# Patient Record
Sex: Male | Born: 1960
Health system: Southern US, Community
[De-identification: ages and names within clinical notes are randomized; demographics above are authoritative.]

## PROBLEM LIST (undated history)

## (undated) DIAGNOSIS — I10 Essential (primary) hypertension: Secondary | ICD-10-CM

## (undated) DIAGNOSIS — K5900 Constipation, unspecified: Secondary | ICD-10-CM

## (undated) DIAGNOSIS — E785 Hyperlipidemia, unspecified: Secondary | ICD-10-CM

## (undated) DIAGNOSIS — T7840XA Allergy, unspecified, initial encounter: Secondary | ICD-10-CM

## (undated) DIAGNOSIS — R079 Chest pain, unspecified: Secondary | ICD-10-CM

## (undated) DIAGNOSIS — M1990 Unspecified osteoarthritis, unspecified site: Secondary | ICD-10-CM

## (undated) DIAGNOSIS — Z789 Other specified health status: Secondary | ICD-10-CM

## (undated) HISTORY — DX: Hyperlipidemia, unspecified: E78.5

## (undated) HISTORY — PX: EYE SURGERY: SHX253

## (undated) HISTORY — DX: Constipation, unspecified: K59.00

## (undated) HISTORY — DX: Allergy, unspecified, initial encounter: T78.40XA

## (undated) HISTORY — DX: Chest pain, unspecified: R07.9

---

## 2003-06-29 ENCOUNTER — Emergency Department (HOSPITAL_COMMUNITY): Admission: EM | Admit: 2003-06-29 | Discharge: 2003-06-30 | Payer: Self-pay | Admitting: Emergency Medicine

## 2005-10-23 ENCOUNTER — Emergency Department (HOSPITAL_COMMUNITY): Admission: EM | Admit: 2005-10-23 | Discharge: 2005-10-23 | Payer: Self-pay | Admitting: Emergency Medicine

## 2009-05-31 ENCOUNTER — Emergency Department (HOSPITAL_COMMUNITY): Admission: EM | Admit: 2009-05-31 | Discharge: 2009-06-01 | Payer: Self-pay | Admitting: Emergency Medicine

## 2011-02-09 LAB — CBC
HCT: 41.9 % (ref 39.0–52.0)
Hemoglobin: 14.5 g/dL (ref 13.0–17.0)
MCV: 87.7 fL (ref 78.0–100.0)
RDW: 13.8 % (ref 11.5–15.5)

## 2011-02-09 LAB — BASIC METABOLIC PANEL
BUN: 19 mg/dL (ref 6–23)
CO2: 26 mEq/L (ref 19–32)
Chloride: 106 mEq/L (ref 96–112)
GFR calc non Af Amer: >60 mL/min (ref >60)
Glucose, Bld: 129 mg/dL — ABNORMAL HIGH (ref 70–99)
Potassium: 3.6 mEq/L (ref 3.5–5.1)
Sodium: 139 mEq/L (ref 135–145)

## 2011-02-09 LAB — DIFFERENTIAL
Basophils Absolute: 0 10*3/uL (ref 0.0–0.1)
Eosinophils Absolute: 0.2 10*3/uL (ref 0.0–0.7)
Eosinophils Relative: 3 % (ref 0–5)
Lymphocytes Relative: 24 % (ref 12–46)
Monocytes Absolute: 0.6 10*3/uL (ref 0.1–1.0)

## 2011-12-04 ENCOUNTER — Other Ambulatory Visit: Payer: Self-pay | Admitting: *Deleted

## 2011-12-04 ENCOUNTER — Ambulatory Visit
Admission: RE | Admit: 2011-12-04 | Discharge: 2011-12-04 | Disposition: A | Discharge status: Home / Self Care | Payer: 59 | Source: Ambulatory Visit | Attending: *Deleted | Admitting: *Deleted

## 2011-12-04 DIAGNOSIS — M545 Low back pain, unspecified: Secondary | ICD-10-CM

## 2011-12-05 ENCOUNTER — Other Ambulatory Visit: Payer: Self-pay

## 2011-12-06 ENCOUNTER — Other Ambulatory Visit: Payer: Self-pay

## 2011-12-26 ENCOUNTER — Encounter (HOSPITAL_COMMUNITY): Payer: Self-pay | Admitting: Pharmacy Technician

## 2011-12-26 ENCOUNTER — Encounter (HOSPITAL_COMMUNITY): Payer: Self-pay

## 2011-12-26 NOTE — Progress Notes (Signed)
Called Dr. Lindalou Hose office, left voice message for Erie Noe requesting orders for surgery.

## 2011-12-29 ENCOUNTER — Encounter (HOSPITAL_COMMUNITY): Payer: Self-pay | Admitting: General Practice

## 2011-12-29 ENCOUNTER — Ambulatory Visit (HOSPITAL_COMMUNITY): Payer: 59 | Admitting: Anesthesiology

## 2011-12-29 ENCOUNTER — Encounter (HOSPITAL_COMMUNITY): Payer: Self-pay

## 2011-12-29 ENCOUNTER — Encounter (HOSPITAL_COMMUNITY)
Admission: RE | Disposition: A | Discharge status: Home / Self Care | Payer: Self-pay | Source: Ambulatory Visit | Attending: Neurosurgery

## 2011-12-29 ENCOUNTER — Encounter (HOSPITAL_COMMUNITY): Payer: Self-pay | Admitting: Anesthesiology

## 2011-12-29 ENCOUNTER — Ambulatory Visit (HOSPITAL_COMMUNITY): Payer: 59

## 2011-12-29 ENCOUNTER — Ambulatory Visit (HOSPITAL_COMMUNITY)
Admission: RE | Admit: 2011-12-29 | Discharge: 2011-12-29 | Disposition: A | Discharge status: Home / Self Care | Payer: 59 | Source: Ambulatory Visit | Attending: Neurosurgery | Admitting: Neurosurgery

## 2011-12-29 DIAGNOSIS — M5126 Other intervertebral disc displacement, lumbar region: Secondary | ICD-10-CM | POA: Insufficient documentation

## 2011-12-29 DIAGNOSIS — M5116 Intervertebral disc disorders with radiculopathy, lumbar region: Secondary | ICD-10-CM | POA: Diagnosis present

## 2011-12-29 DIAGNOSIS — F17200 Nicotine dependence, unspecified, uncomplicated: Secondary | ICD-10-CM | POA: Insufficient documentation

## 2011-12-29 HISTORY — PX: LUMBAR LAMINECTOMY/DECOMPRESSION MICRODISCECTOMY: SHX5026

## 2011-12-29 HISTORY — DX: Unspecified osteoarthritis, unspecified site: M19.90

## 2011-12-29 HISTORY — PX: LAMINECTOMY AND MICRODISCECTOMY LUMBAR SPINE: SHX1913

## 2011-12-29 HISTORY — DX: Other specified health status: Z78.9

## 2011-12-29 LAB — ABO/RH: ABO/RH(D): O NEG

## 2011-12-29 LAB — DIFFERENTIAL
Basophils Absolute: 0 10*3/uL (ref 0.0–0.1)
Basophils Relative: 0 % (ref 0–1)
Eosinophils Relative: 3 % (ref 0–5)
Monocytes Absolute: 0.4 10*3/uL (ref 0.1–1.0)
Neutro Abs: 3.1 10*3/uL (ref 1.7–7.7)

## 2011-12-29 LAB — SURGICAL PCR SCREEN
MRSA, PCR: NEGATIVE
Staphylococcus aureus: NEGATIVE

## 2011-12-29 LAB — CBC
HCT: 42.6 % (ref 39.0–52.0)
MCHC: 34.5 g/dL (ref 30.0–36.0)
MCV: 84.7 fL (ref 78.0–100.0)
Platelets: 207 10*3/uL (ref 150–400)
RDW: 13.5 % (ref 11.5–15.5)

## 2011-12-29 SURGERY — LUMBAR LAMINECTOMY/DECOMPRESSION MICRODISCECTOMY 1 LEVEL
Anesthesia: General | Site: Back | Laterality: Right | Wound class: Clean

## 2011-12-29 MED ORDER — ONDANSETRON HCL 4 MG/2ML IJ SOLN
INTRAMUSCULAR | Status: DC | PRN
  Administered 2011-12-29: 4 mg via INTRAVENOUS

## 2011-12-29 MED ORDER — ONDANSETRON HCL 4 MG/2ML IJ SOLN: 4.0000 mg | INTRAMUSCULAR | Status: DC | PRN

## 2011-12-29 MED ORDER — CEFAZOLIN SODIUM-DEXTROSE 2-3 GM-% IV SOLR: 2.0000 g | INTRAVENOUS | Status: DC

## 2011-12-29 MED ORDER — MUPIROCIN 2 % EX OINT
TOPICAL_OINTMENT | CUTANEOUS | Status: AC
  Filled 2011-12-29: qty 22

## 2011-12-29 MED ORDER — MENTHOL 3 MG MT LOZG: 1.0000 | LOZENGE | OROMUCOSAL | Status: DC | PRN

## 2011-12-29 MED ORDER — CEFAZOLIN SODIUM 1-5 GM-% IV SOLN
1.0000 g | Freq: Three times a day (TID) | INTRAVENOUS | Status: DC
  Administered 2011-12-29: 1 g via INTRAVENOUS
  Filled 2011-12-29 (×3): qty 50

## 2011-12-29 MED ORDER — KETOROLAC TROMETHAMINE 30 MG/ML IJ SOLN
30.0000 mg | Freq: Four times a day (QID) | INTRAMUSCULAR | Status: DC
  Filled 2011-12-29: qty 1

## 2011-12-29 MED ORDER — ACETAMINOPHEN 650 MG RE SUPP: 650.0000 mg | RECTAL | Status: DC | PRN

## 2011-12-29 MED ORDER — LACTATED RINGERS IV SOLN: INTRAVENOUS | Status: DC

## 2011-12-29 MED ORDER — GLYCOPYRROLATE 0.2 MG/ML IJ SOLN
INTRAMUSCULAR | Status: DC | PRN
  Administered 2011-12-29: .6 mg via INTRAVENOUS

## 2011-12-29 MED ORDER — HEMOSTATIC AGENTS (NO CHARGE) OPTIME
TOPICAL | Status: DC | PRN
  Administered 2011-12-29: 1 via TOPICAL

## 2011-12-29 MED ORDER — MIDAZOLAM HCL 2 MG/2ML IJ SOLN
INTRAMUSCULAR | Status: AC
  Filled 2011-12-29: qty 2

## 2011-12-29 MED ORDER — SODIUM CHLORIDE 0.9 % IV SOLN
250.0000 mL | INTRAVENOUS | Status: DC
  Administered 2011-12-29: 250 mL via INTRAVENOUS

## 2011-12-29 MED ORDER — BACITRACIN 50000 UNITS IM SOLR
INTRAMUSCULAR | Status: AC
  Filled 2011-12-29: qty 1

## 2011-12-29 MED ORDER — CYCLOBENZAPRINE HCL 10 MG PO TABS: 10.0000 mg | ORAL_TABLET | Freq: Three times a day (TID) | ORAL | Status: DC | PRN

## 2011-12-29 MED ORDER — CYCLOBENZAPRINE HCL 10 MG PO TABS: 10.0000 mg | ORAL_TABLET | Freq: Three times a day (TID) | ORAL | Status: AC | PRN

## 2011-12-29 MED ORDER — THROMBIN 5000 UNITS EX KIT
PACK | CUTANEOUS | Status: DC | PRN
  Administered 2011-12-29: 5000 [IU] via TOPICAL

## 2011-12-29 MED ORDER — PROMETHAZINE HCL 25 MG/ML IJ SOLN: 6.2500 mg | INTRAMUSCULAR | Status: DC | PRN

## 2011-12-29 MED ORDER — SODIUM CHLORIDE 0.9 % IJ SOLN: 3.0000 mL | INTRAMUSCULAR | Status: DC | PRN

## 2011-12-29 MED ORDER — BISACODYL 10 MG RE SUPP: 10.0000 mg | Freq: Every day | RECTAL | Status: DC | PRN

## 2011-12-29 MED ORDER — ROCURONIUM BROMIDE 100 MG/10ML IV SOLN
INTRAVENOUS | Status: DC | PRN
  Administered 2011-12-29: 50 mg via INTRAVENOUS

## 2011-12-29 MED ORDER — PROPOFOL 10 MG/ML IV EMUL
INTRAVENOUS | Status: DC | PRN
  Administered 2011-12-29: 200 mg via INTRAVENOUS

## 2011-12-29 MED ORDER — PHENYLEPHRINE HCL 10 MG/ML IJ SOLN
INTRAMUSCULAR | Status: DC | PRN
  Administered 2011-12-29: 80 ug via INTRAVENOUS

## 2011-12-29 MED ORDER — SODIUM CHLORIDE 0.9 % IJ SOLN: 3.0000 mL | Freq: Two times a day (BID) | INTRAMUSCULAR | Status: DC

## 2011-12-29 MED ORDER — HYDROCODONE-ACETAMINOPHEN 5-325 MG PO TABS: 1.0000 | ORAL_TABLET | ORAL | Status: DC | PRN

## 2011-12-29 MED ORDER — CEFAZOLIN SODIUM-DEXTROSE 2-3 GM-% IV SOLR
INTRAVENOUS | Status: AC
  Filled 2011-12-29: qty 50

## 2011-12-29 MED ORDER — SODIUM CHLORIDE 0.9 % IR SOLN
Status: DC | PRN
  Administered 2011-12-29: 15:00:00

## 2011-12-29 MED ORDER — HYDROMORPHONE HCL PF 1 MG/ML IJ SOLN
INTRAMUSCULAR | Status: AC
  Filled 2011-12-29: qty 1

## 2011-12-29 MED ORDER — BUPIVACAINE HCL (PF) 0.25 % IJ SOLN
INTRAMUSCULAR | Status: DC | PRN
  Administered 2011-12-29: 20 mL

## 2011-12-29 MED ORDER — POLYETHYLENE GLYCOL 3350 17 G PO PACK
17.0000 g | PACK | Freq: Every day | ORAL | Status: DC | PRN
  Filled 2011-12-29: qty 1

## 2011-12-29 MED ORDER — MIDAZOLAM HCL 5 MG/5ML IJ SOLN
INTRAMUSCULAR | Status: DC | PRN
  Administered 2011-12-29: 2 mg via INTRAVENOUS

## 2011-12-29 MED ORDER — DIPHENHYDRAMINE HCL 50 MG/ML IJ SOLN
INTRAMUSCULAR | Status: AC
  Filled 2011-12-29: qty 1

## 2011-12-29 MED ORDER — SODIUM CHLORIDE 0.9 % IV SOLN
INTRAVENOUS | Status: AC
  Filled 2011-12-29: qty 500

## 2011-12-29 MED ORDER — DEXAMETHASONE SODIUM PHOSPHATE 10 MG/ML IJ SOLN
INTRAMUSCULAR | Status: DC | PRN
  Administered 2011-12-29: 10 mg via INTRAVENOUS

## 2011-12-29 MED ORDER — NEOSTIGMINE METHYLSULFATE 1 MG/ML IJ SOLN
INTRAMUSCULAR | Status: DC | PRN
  Administered 2011-12-29: 4 mg via INTRAVENOUS

## 2011-12-29 MED ORDER — OXYCODONE-ACETAMINOPHEN 5-325 MG PO TABS: 1.0000 | ORAL_TABLET | ORAL | Status: AC | PRN

## 2011-12-29 MED ORDER — FENTANYL CITRATE 0.05 MG/ML IJ SOLN
INTRAMUSCULAR | Status: DC | PRN
  Administered 2011-12-29: 250 ug via INTRAVENOUS

## 2011-12-29 MED ORDER — ALUM & MAG HYDROXIDE-SIMETH 200-200-20 MG/5ML PO SUSP: 30.0000 mL | Freq: Four times a day (QID) | ORAL | Status: DC | PRN

## 2011-12-29 MED ORDER — HYDROMORPHONE HCL PF 1 MG/ML IJ SOLN: 0.5000 mg | INTRAMUSCULAR | Status: DC | PRN

## 2011-12-29 MED ORDER — ACETAMINOPHEN 325 MG PO TABS: 650.0000 mg | ORAL_TABLET | ORAL | Status: DC | PRN

## 2011-12-29 MED ORDER — 0.9 % SODIUM CHLORIDE (POUR BTL) OPTIME
TOPICAL | Status: DC | PRN
  Administered 2011-12-29: 1000 mL

## 2011-12-29 MED ORDER — KETOROLAC TROMETHAMINE 30 MG/ML IJ SOLN
INTRAMUSCULAR | Status: DC | PRN
  Administered 2011-12-29: 30 mg via INTRAVENOUS

## 2011-12-29 MED ORDER — SENNA 8.6 MG PO TABS
1.0000 | ORAL_TABLET | Freq: Two times a day (BID) | ORAL | Status: DC
  Filled 2011-12-29: qty 1

## 2011-12-29 MED ORDER — OXYCODONE-ACETAMINOPHEN 5-325 MG PO TABS: 1.0000 | ORAL_TABLET | ORAL | Status: DC | PRN

## 2011-12-29 MED ORDER — HYDROMORPHONE HCL PF 1 MG/ML IJ SOLN
0.2500 mg | INTRAMUSCULAR | Status: DC | PRN
  Administered 2011-12-29 (×2): 0.5 mg via INTRAVENOUS

## 2011-12-29 MED ORDER — DIPHENHYDRAMINE HCL 50 MG/ML IJ SOLN: 50.0000 mg | Freq: Once | INTRAMUSCULAR | Status: DC

## 2011-12-29 MED ORDER — LACTATED RINGERS IV SOLN
INTRAVENOUS | Status: DC | PRN
  Administered 2011-12-29 (×2): via INTRAVENOUS

## 2011-12-29 MED ORDER — PHENOL 1.4 % MT LIQD: 1.0000 | OROMUCOSAL | Status: DC | PRN

## 2011-12-29 MED ORDER — ZOLPIDEM TARTRATE 10 MG PO TABS: 10.0000 mg | ORAL_TABLET | Freq: Every evening | ORAL | Status: DC | PRN

## 2011-12-29 MED ORDER — FLEET ENEMA 7-19 GM/118ML RE ENEM: 1.0000 | ENEMA | Freq: Once | RECTAL | Status: DC | PRN

## 2011-12-29 SURGICAL SUPPLY — 53 items
BAG DECANTER FOR FLEXI CONT (MISCELLANEOUS) ×2 IMPLANT
BENZOIN TINCTURE PRP APPL 2/3 (GAUZE/BANDAGES/DRESSINGS) ×2 IMPLANT
BLADE SURG ROTATE 9660 (MISCELLANEOUS) IMPLANT
BRUSH SCRUB EZ PLAIN DRY (MISCELLANEOUS) ×2 IMPLANT
BUR CUTTER 7.0 ROUND (BURR) ×2 IMPLANT
CANISTER SUCTION 2500CC (MISCELLANEOUS) ×2 IMPLANT
CLOSURE STERI STRIP 1/2 X4 (GAUZE/BANDAGES/DRESSINGS) ×2 IMPLANT
CLOTH BEACON ORANGE TIMEOUT ST (SAFETY) ×2 IMPLANT
CONT SPEC 4OZ CLIKSEAL STRL BL (MISCELLANEOUS) ×2 IMPLANT
DECANTER SPIKE VIAL GLASS SM (MISCELLANEOUS) ×2 IMPLANT
DERMABOND ADVANCED (GAUZE/BANDAGES/DRESSINGS) ×1
DERMABOND ADVANCED .7 DNX12 (GAUZE/BANDAGES/DRESSINGS) ×1 IMPLANT
DRAPE LAPAROTOMY 100X72X124 (DRAPES) ×2 IMPLANT
DRAPE MICROSCOPE LEICA (MISCELLANEOUS) ×2 IMPLANT
DRAPE POUCH INSTRU U-SHP 10X18 (DRAPES) ×2 IMPLANT
DRAPE PROXIMA HALF (DRAPES) IMPLANT
DRAPE SURG 17X23 STRL (DRAPES) ×4 IMPLANT
ELECT REM PT RETURN 9FT ADLT (ELECTROSURGICAL) ×2
ELECTRODE REM PT RTRN 9FT ADLT (ELECTROSURGICAL) ×1 IMPLANT
GAUZE SPONGE 4X4 12PLY STRL LF (GAUZE/BANDAGES/DRESSINGS) ×2 IMPLANT
GAUZE SPONGE 4X4 16PLY XRAY LF (GAUZE/BANDAGES/DRESSINGS) IMPLANT
GLOVE BIO SURGEON STRL SZ 6.5 (GLOVE) ×4 IMPLANT
GLOVE BIOGEL PI IND STRL 6.5 (GLOVE) ×2 IMPLANT
GLOVE BIOGEL PI IND STRL 8.5 (GLOVE) ×1 IMPLANT
GLOVE BIOGEL PI INDICATOR 6.5 (GLOVE) ×2
GLOVE BIOGEL PI INDICATOR 8.5 (GLOVE) ×1
GLOVE ECLIPSE 8.5 STRL (GLOVE) ×4 IMPLANT
GLOVE EXAM NITRILE LRG STRL (GLOVE) IMPLANT
GLOVE EXAM NITRILE MD LF STRL (GLOVE) ×2 IMPLANT
GLOVE EXAM NITRILE XL STR (GLOVE) IMPLANT
GLOVE EXAM NITRILE XS STR PU (GLOVE) IMPLANT
GLOVE OPTIFIT SS 6.5 STRL BRWN (GLOVE) ×4 IMPLANT
GOWN BRE IMP SLV AUR LG STRL (GOWN DISPOSABLE) ×2 IMPLANT
GOWN BRE IMP SLV AUR XL STRL (GOWN DISPOSABLE) ×6 IMPLANT
GOWN STRL REIN 2XL LVL4 (GOWN DISPOSABLE) IMPLANT
KIT BASIN OR (CUSTOM PROCEDURE TRAY) ×2 IMPLANT
KIT ROOM TURNOVER OR (KITS) ×2 IMPLANT
NEEDLE HYPO 22GX1.5 SAFETY (NEEDLE) ×2 IMPLANT
NEEDLE SPNL 22GX3.5 QUINCKE BK (NEEDLE) ×2 IMPLANT
NS IRRIG 1000ML POUR BTL (IV SOLUTION) ×2 IMPLANT
PACK LAMINECTOMY NEURO (CUSTOM PROCEDURE TRAY) ×2 IMPLANT
PAD ARMBOARD 7.5X6 YLW CONV (MISCELLANEOUS) ×6 IMPLANT
RUBBERBAND STERILE (MISCELLANEOUS) ×4 IMPLANT
SPONGE GAUZE 4X4 12PLY (GAUZE/BANDAGES/DRESSINGS) ×2 IMPLANT
SPONGE SURGIFOAM ABS GEL SZ50 (HEMOSTASIS) ×2 IMPLANT
STRIP CLOSURE SKIN 1/2X4 (GAUZE/BANDAGES/DRESSINGS) ×2 IMPLANT
SUT VIC AB 2-0 CT1 18 (SUTURE) ×2 IMPLANT
SUT VIC AB 3-0 SH 8-18 (SUTURE) ×2 IMPLANT
SYR 20ML ECCENTRIC (SYRINGE) ×2 IMPLANT
TAPE CLOTH SURG 4X10 WHT LF (GAUZE/BANDAGES/DRESSINGS) ×2 IMPLANT
TOWEL OR 17X24 6PK STRL BLUE (TOWEL DISPOSABLE) ×2 IMPLANT
TOWEL OR 17X26 10 PK STRL BLUE (TOWEL DISPOSABLE) ×2 IMPLANT
WATER STERILE IRR 1000ML POUR (IV SOLUTION) ×2 IMPLANT

## 2011-12-29 NOTE — Transfer of Care (Signed)
Immediate Anesthesia Transfer of Care Note  Patient: Eric Everett  Procedure(s) Performed: Procedure(s) (LRB): LUMBAR LAMINECTOMY/DECOMPRESSION MICRODISCECTOMY 1 LEVEL (Right)  Patient Location: PACU  Anesthesia Type: General  Level of Consciousness: awake, alert  and oriented  Airway & Oxygen Therapy: Patient Spontanous Breathing and Patient connected to face mask oxygen  Post-op Assessment: Report given to PACU RN and Post -op Vital signs reviewed and stable  Post vital signs: Reviewed and stable  Complications: No apparent anesthesia complications

## 2011-12-29 NOTE — H&P (Signed)
Eric Everett is an 51 y.o. male.   Chief Complaint: Back and right lower extremity pain HPI: 51 year old male with severe back and right lower extremity pain failing conservative management. Workup demonstrates evidence of a right-sided L4-5 disc herniation. Patient has failed conservative management and presents now for laminotomy and microdiscectomy. No left-sided symptoms. No bowel or bladder symptoms.  Past Medical History  Diagnosis Date  . No pertinent past medical history   . Arthritis     L4-5- HNP    Past Surgical History  Procedure Date  . Eye surgery     1982, sports injury - R eye    Family History  Problem Relation Age of Onset  . Anesthesia problems Neg Hx    Social History:  reports that he has been smoking Cigarettes.  He has a 5 pack-year smoking history. He does not have any smokeless tobacco history on file. He reports that he drinks alcohol. He reports that he uses illicit drugs ("Crack" cocaine, Cocaine, and Marijuana).  Allergies: No Known Allergies  Medications Prior to Admission  Medication Dose Route Frequency Provider Last Rate Last Dose  . ceFAZolin (ANCEF) 2-3 GM-% IVPB SOLR           . ceFAZolin (ANCEF) IVPB 2 g/50 mL premix  2 g Intravenous 30 min Pre-Op Temple Pacini, MD      . diphenhydrAMINE (BENADRYL) 50 MG/ML injection           . diphenhydrAMINE (BENADRYL) injection 50 mg  50 mg Intravenous Once Temple Pacini, MD      . mupirocin ointment (BACTROBAN) 2 %            Medications Prior to Admission  Medication Sig Dispense Refill  . oxyCODONE-acetaminophen (PERCOCET) 10-650 MG per tablet Take 1 tablet by mouth every 6 (six) hours as needed. For pain        Results for orders placed during the hospital encounter of 12/29/11 (from the past 48 hour(s))  CBC     Status: Normal   Collection Time   12/29/11 11:45 AM      Component Value Range Comment   WBC 5.9  4.0 - 10.5 (K/uL)    RBC 5.03  4.22 - 5.81 (MIL/uL)    Hemoglobin 14.7  13.0 - 17.0  (g/dL)    HCT 45.4  09.8 - 11.9 (%)    MCV 84.7  78.0 - 100.0 (fL)    MCH 29.2  26.0 - 34.0 (pg)    MCHC 34.5  30.0 - 36.0 (g/dL)    RDW 14.7  82.9 - 56.2 (%)    Platelets 207  150 - 400 (K/uL)   DIFFERENTIAL     Status: Normal   Collection Time   12/29/11 11:45 AM      Component Value Range Comment   Neutrophils Relative 53  43 - 77 (%)    Neutro Abs 3.1  1.7 - 7.7 (K/uL)    Lymphocytes Relative 37  12 - 46 (%)    Lymphs Abs 2.2  0.7 - 4.0 (K/uL)    Monocytes Relative 7  3 - 12 (%)    Monocytes Absolute 0.4  0.1 - 1.0 (K/uL)    Eosinophils Relative 3  0 - 5 (%)    Eosinophils Absolute 0.2  0.0 - 0.7 (K/uL)    Basophils Relative 0  0 - 1 (%)    Basophils Absolute 0.0  0.0 - 0.1 (K/uL)   TYPE AND SCREEN  Status: Normal (Preliminary result)   Collection Time   12/29/11 12:20 PM      Component Value Range Comment   ABO/RH(D) O NEG      Antibody Screen PENDING      Sample Expiration 01/01/2012      No results found.  Review of Systems  Constitutional: Negative.   HENT: Negative.   Eyes: Negative.   Respiratory: Negative.   Cardiovascular: Negative.   Gastrointestinal: Negative.   Genitourinary: Negative.   Musculoskeletal: Negative.   Skin: Negative.   Neurological: Negative.   Endo/Heme/Allergies: Negative.   Psychiatric/Behavioral: Negative.     Blood pressure 153/81, pulse 62, temperature 98.2 F (36.8 C), temperature source Oral, resp. rate 20, height 6' (1.829 m), weight 102.059 kg (225 lb), SpO2 95.00%. Physical Exam  Constitutional: He is oriented to person, place, and time. He appears well-developed and well-nourished. No distress.  HENT:  Head: Normocephalic and atraumatic.  Right Ear: External ear normal.  Left Ear: External ear normal.  Nose: Nose normal.  Mouth/Throat: Oropharynx is clear and moist.  Eyes: Conjunctivae and EOM are normal. Pupils are equal, round, and reactive to light.  Neck: Normal range of motion. Neck supple. No tracheal deviation  present. No thyromegaly present.  Cardiovascular: Normal rate, regular rhythm, normal heart sounds and intact distal pulses.  Exam reveals no friction rub.   No murmur heard. Respiratory: Effort normal and breath sounds normal. No respiratory distress. He has no wheezes.  GI: Soft. Bowel sounds are normal. He exhibits no distension. There is no tenderness.  Musculoskeletal: Normal range of motion. He exhibits no edema and no tenderness.  Neurological: He is alert and oriented to person, place, and time. He has normal reflexes. He displays normal reflexes. No cranial nerve deficit. He exhibits normal muscle tone. Coordination normal.  Skin: Skin is warm and dry. No rash noted. He is not diaphoretic. No erythema. No pallor.  Psychiatric: He has a normal mood and affect. His behavior is normal. Judgment and thought content normal.     Assessment/Plan Right L4-5 herniated nucleus pulposus with radiculopathy. Plan right L4-5 laminotomy microdiscectomy. Risks and benefits have been explained. Patient wishes to proceed.  Lockie Bothun A 12/29/2011, 1:25 PM

## 2011-12-29 NOTE — Discharge Summary (Signed)
Physician Discharge Summary  Patient ID: Eric Everett MRN: [id number] DOB/AGE: 07/13/1961 51 y.o.  Admit date: 12/29/2011 Discharge date: 12/29/2011  Admission Diagnoses:  Discharge Diagnoses:  Principal Problem:  *Lumbar disc herniation with radiculopathy   Discharged Condition: good  Hospital Course: Admitted and underwent an uncomplicated laminotomy and microdiscectomy on the right at L4-5. Postop radicular pain has resolved. Strength and sensation intact. Patient has voided and doing well. Plan for discharge home  Consults:   Significant Diagnostic Studies:   Treatments:   Discharge Exam: Blood pressure 123/72, pulse 64, temperature 97.9 F (36.6 C), temperature source Oral, resp. rate 19, height 6' (1.829 m), weight 102.059 kg (225 lb), SpO2 98.00%. Awake and alert oriented and appropriate. Cranial nerve function is intact. Motor and sensory function intact. Wound healing well. Chest and abdomen benign.  Disposition: Final discharge disposition not confirmed   Medication List  As of 12/29/2011  5:41 PM   STOP taking these medications         oxyCODONE-acetaminophen 10-650 MG per tablet         TAKE these medications         cyclobenzaprine 10 MG tablet   Commonly known as: FLEXERIL   Take 1 tablet (10 mg total) by mouth 3 (three) times daily as needed for muscle spasms.      oxyCODONE-acetaminophen 5-325 MG per tablet   Commonly known as: PERCOCET   Take 1-2 tablets by mouth every 4 (four) hours as needed for pain.           Follow-up Information    Follow up with Zeola Brys A, MD. Call in 1 week.   Contact information:   [address] [place] [address] [phone]          Signed: Temple Pacini 12/29/2011, 5:41 PM

## 2011-12-29 NOTE — Anesthesia Postprocedure Evaluation (Signed)
  Anesthesia Post-op Note  Patient: Eric Everett  Procedure(s) Performed: Procedure(s) (LRB): LUMBAR LAMINECTOMY/DECOMPRESSION MICRODISCECTOMY 1 LEVEL (Right)  Patient Location: PACU  Anesthesia Type: General  Level of Consciousness: awake, alert  and oriented  Airway and Oxygen Therapy: Patient Spontanous Breathing and Patient connected to nasal cannula oxygen  Post-op Pain: mild  Post-op Assessment: Post-op Vital signs reviewed, Patient's Cardiovascular Status Stable, Respiratory Function Stable, Patent Airway, No signs of Nausea or vomiting and Pain level controlled  Post-op Vital Signs: Reviewed and stable  Complications: No apparent anesthesia complications

## 2011-12-29 NOTE — Anesthesia Preprocedure Evaluation (Signed)
Anesthesia Evaluation  Patient identified by MRN, date of birth, ID band Patient awake    Reviewed: Allergy & Precautions, H&P , NPO status , Patient's Chart, lab work & pertinent test results  History of Anesthesia Complications Negative for: history of anesthetic complications  Airway Mallampati: I TM Distance: >3 FB Neck ROM: Full    Dental No notable dental hx. (+) Teeth Intact and Missing   Pulmonary Current Smoker,  clear to auscultation  Pulmonary exam normal       Cardiovascular neg cardio ROS Regular Normal    Neuro/Psych  Neuromuscular disease (chronic back pain- narcotic dependent) Negative Psych ROS   GI/Hepatic negative GI ROS, Neg liver ROS,   Endo/Other  Negative Endocrine ROS  Renal/GU negative Renal ROS     Musculoskeletal   Abdominal (+) obese,   Peds  Hematology negative hematology ROS (+)   Anesthesia Other Findings   Reproductive/Obstetrics                           Anesthesia Physical Anesthesia Plan  ASA: II  Anesthesia Plan: General   Post-op Pain Management:    Induction: Intravenous  Airway Management Planned: Oral ETT  Additional Equipment:   Intra-op Plan:   Post-operative Plan: Extubation in OR  Informed Consent: I have reviewed the patients History and Physical, chart, labs and discussed the procedure including the risks, benefits and alternatives for the proposed anesthesia with the patient or authorized representative who has indicated his/her understanding and acceptance.   Dental advisory given  Plan Discussed with: Surgeon and CRNA  Anesthesia Plan Comments: (Plan routine monitors, GETA)        Anesthesia Quick Evaluation

## 2011-12-29 NOTE — Op Note (Signed)
Date of procedure: 12/29/2011  Date of dictation: Same  Service: Neurosurgery  Preoperative diagnosis: Right L4-5 herniated nucleus pulposus with radiculopathy  Postoperative diagnosis: Same  Procedure Name: Right L4-5 laminotomy and microdiscectomy  Surgeon:Rudean Icenhour A.Braylee Lal, M.D.  Asst. Surgeon: Barnett Abu  Anesthesia: General  Indication: A 51 year old male with right lower extremity pain consistent with a right L5 radiculopathy. Workup demonstrates right L4-5 disc herniation with inferior free fragment causing compression of the right L5 nerve root. Patient presents now for microdiscectomy in hopes of improving his symptoms.  Operative note: After induction of anesthesia, patient positioned prone onto the Wilson frame and appropriate padded. Lumbar region prepped and draped sterilely. 10 blade used to make a linear skin incision overlying the L4-5 interspace. Subperiosteal dissection then performed spine was exposed x-ray was taken and the L3-4 level was localized. Dissection redirected 1 level caudally. Retractor was replaced. Laminotomy was then performed at the L4-5 level using high-speed drill and Kerrison rongeurs. Underlying thecal sac was identified. Ligament flavum was elevated and resected in piecemeal fashion. Underlying thecal sac and right L5 nerve or were identified. Microscope was brought into the field and used for microdissection of the epidural space. Epidural venous plexus was coagulated and cut. Thecal sac and L5 nerve root gently mobilized track towards midline. Disc herniation and disc space were identified. The disc was then incised 15 blade in a rectangular fashion. Y. disc space clear was achieved using pituitary rongeurs operative of rongeurs and Epstein curettes. The inferior free fragment was dissected free and fully resected. At this point a very thorough discectomy been achieved. There is a as of injury to thecal sac and nerve roots. Wound is then irrigated out  solution. Gelfoam with postoperative hemostasis which then the beaded. Microscope and retractor system were removed. Hemostasis of the muscle 2.left heart. Wounds and close in layers with Vicryl sutures. Steri-Strips and sterile dressing were applied. There were no apparent outpatient. Patient tolerated well and returned to the recovery room postop.

## 2011-12-29 NOTE — Preoperative (Signed)
Beta Blockers   Reason not to administer Beta Blockers:Not Applicable 

## 2011-12-29 NOTE — Brief Op Note (Signed)
12/29/2011  3:32 PM  PATIENT:  Chauncey Cruel  51 y.o. male  PRE-OPERATIVE DIAGNOSIS:  right herniated nucleus pulposus, lumbar four-five  POST-OPERATIVE DIAGNOSIS:  right herniated nucleus pulposus, lumbar four-five  PROCEDURE:  Procedure(s) (LRB): LUMBAR LAMINECTOMY/DECOMPRESSION MICRODISCECTOMY 1 LEVEL (Right)  SURGEON:  Surgeon(s) and Role:    * Temple Pacini, MD - Primary    * Stefani Dama, MD - Assisting  PHYSICIAN ASSISTANT:   ASSISTANTS: Barnett Abu  ANESTHESIA:   general  EBL:  Total I/O In: 1000 [I.V.:1000] Out: 50 [Blood:50]  BLOOD ADMINISTERED:none  DRAINS: none   LOCAL MEDICATIONS USED:  MARCAINE     SPECIMEN:  No Specimen  DISPOSITION OF SPECIMEN:  N/A  COUNTS:  YES  TOURNIQUET:  * No tourniquets in log *  DICTATION: .Dragon Dictation  PLAN OF CARE: Admit for overnight observation  PATIENT DISPOSITION:  PACU - hemodynamically stable.   Delay start of Pharmacological VTE agent (>24hrs) due to surgical blood loss or risk of bleeding: yes

## 2011-12-31 ENCOUNTER — Encounter (HOSPITAL_COMMUNITY): Payer: Self-pay | Admitting: Neurosurgery

## 2016-06-18 ENCOUNTER — Ambulatory Visit (INDEPENDENT_AMBULATORY_CARE_PROVIDER_SITE_OTHER): Payer: 59 | Admitting: Physician Assistant

## 2016-06-18 VITALS — BP 134/76 | HR 63 | Temp 98.3°F | Resp 18 | Ht 72.0 in | Wt 210.0 lb

## 2016-06-18 DIAGNOSIS — T7840XA Allergy, unspecified, initial encounter: Secondary | ICD-10-CM

## 2016-06-18 DIAGNOSIS — R220 Localized swelling, mass and lump, head: Secondary | ICD-10-CM

## 2016-06-18 MED ORDER — RANITIDINE HCL 150 MG PO TABS: 150.0000 mg | ORAL_TABLET | Freq: Two times a day (BID) | ORAL | 0 refills | Status: DC

## 2016-06-18 MED ORDER — CETIRIZINE HCL 10 MG PO TABS: 10.0000 mg | ORAL_TABLET | Freq: Every day | ORAL | 1 refills | Status: DC

## 2016-06-18 MED ORDER — METHYLPREDNISOLONE SODIUM SUCC 125 MG IJ SOLR
125.0000 mg | Freq: Once | INTRAMUSCULAR | Status: AC
  Administered 2016-06-18: 125 mg via INTRAMUSCULAR

## 2016-06-18 MED ORDER — HYDROCORTISONE ACETATE 25 MG RE SUPP: 25.0000 mg | Freq: Two times a day (BID) | RECTAL | 0 refills | Status: DC

## 2016-06-18 MED ORDER — EPINEPHRINE 0.3 MG/0.3ML IJ SOAJ: 0.3000 mg | Freq: Once | INTRAMUSCULAR | 1 refills | Status: AC

## 2016-06-18 NOTE — Progress Notes (Signed)
By signing my name below, I, [name], attest that this documentation has been prepared under the direction and in the presence of Trena PlattStephanie English, MD.  Electronically Signed: Arvilla MarketMesha Guinyard, Medical Scribe. 06/18/16. 8:58 AM.  Subjective:    Patient ID: Eric Everett, male    DOB: Sep 21, 1961, 55 y.o.   MRN: [id number]  HPI Chief Complaint  Patient presents with   Facial Swelling    Since "Sunday     HPI Comments: Eric Everett is a 55 y.o. male who presents to the [facility] complaining episodic facial swelling onset 3 days ago. Pt mentions his swelling went down 2 days ago, but his face became hot and itchy again when his lips began to swell. Pt mentions he dyed on his beard 6 days ago. Pt has been using this same dye for a long time, but suspects there is a new ingredient in the dye that he's allergic to. Pt mentions he's had facial swelling in the past, but not this bad. Pt took children's benadryl last night with little to relief to his symptoms. Pt denies trying any new lotions, soaps, or creams on his face. Pt denies, rash, SOB, and trouble breathing.  Patient Active Problem List   Diagnosis Date Noted   Lumbar disc herniation with radiculopathy 12/29/2011   Past Medical History:  Diagnosis Date   Allergy    Arthritis    L4-5- HNP   No pertinent past medical history    Past Surgical History:  Procedure Laterality Date   EYE SURGERY     19" 80, sports injury - R eye   LAMINECTOMY AND MICRODISCECTOMY LUMBAR SPINE  12/29/11   right L4-5   LUMBAR LAMINECTOMY/DECOMPRESSION MICRODISCECTOMY  12/29/2011   Procedure: LUMBAR LAMINECTOMY/DECOMPRESSION MICRODISCECTOMY 1 LEVEL;  Surgeon: Temple PaciniHenry A Pool, MD;  Location: [facility];  Service: Neurosurgery;  Laterality: Right;  Right Lumbar Four-Five Laminectomy Microdiskectomy   No Known Allergies Prior to Admission medications   Not on File   Social History   Social History   Marital status:  Married    Spouse name: N/A   Number of children: N/A   Years of education: N/A   Occupational History   Not on file.   Social History Main Topics   Smoking status: Former Smoker    Packs/day: 0.50    Years: 20.00    Types: Cigarettes   Smokeless tobacco: Never Used     Comment: "no consult; I quit after today" (12/29/11)   Alcohol use Yes     Comment: occassional   Drug use:     Types: "Crack" cocaine, Cocaine, Marijuana     Comment: no drug use isince 12/01/1994   Sexual activity: No   Other Topics Concern   Not on file   Social History Narrative   No narrative on file   Depression screen [facility] 2/9 06/18/2016  Decreased Interest 0  Down, Depressed, Hopeless 0  PHQ - 2 Score 0    Review of Systems  HENT: Positive for facial swelling.   Respiratory: Negative for shortness of breath and wheezing.   Skin: Negative for rash.  ROS reviewed. Otherwise unremarkable, unless listed above.  Objective:  Physical Exam  Constitutional: He appears well-developed and well-nourished. No distress.  HENT:  Head: Normocephalic and atraumatic.  Mouth/Throat: Oropharynx is clear and moist.  Left sided superficial upper lip swelling that extended around to the lower left side of the lip. Tongue was nl  Eyes: Conjunctivae are normal.  Neck: Neck supple.  Cardiovascular: Normal rate.   Pulmonary/Chest: Effort normal and breath sounds normal. No respiratory distress. He has no decreased breath sounds. He has no wheezes. He has no rales.  Neurological: He is alert.  Skin: Skin is warm and dry.  Psychiatric: He has a normal mood and affect. His behavior is normal.  Nursing note and vitals reviewed.  BP 122/78 (BP Location: Right Arm, Patient Position: Sitting, Cuff Size: Normal)    Pulse (!) 55    Temp 98 F (36.7 C) (Oral)    Resp 16    Ht 6' (1.829 m)    Wt 210 lb (95.3 kg)    SpO2 98%    BMI 28.48 kg/m    Assessment & Plan:  55 year old male for an allergic  reaction. Solu-medrol given today.  Advised h2blocker and anti-histamine at this time. Return if alarming symptoms occur discussed.  Allergic reaction, initial encounter - Plan: methylPREDNISolone sodium succinate (SOLU-MEDROL) 125 mg/2 mL injection 125 mg, ranitidine (ZANTAC) 150 MG tablet, cetirizine (ZYRTEC) 10 MG tablet, EPINEPHrine 0.3 mg/0.3 mL IJ SOAJ injection  Trena PlattStephanie English, PA-C [facility] 8/18/20178:12 AM  I personally performed the services described in this documentation, which was scribed in my presence. The recorded information has been reviewed and is accurate.  Patient returned with the sensation that his throat was closing up.  I advised that we will place an IV, epinephrine, and benadryl.  He will need to be monitored at the hospital.  He has declined.  He will go with wife to allergist.  Discussed the risk.  He has signed an ams form today.

## 2016-06-18 NOTE — Patient Instructions (Addendum)
Please avoid using the dye at this time Please take medication as prescribed.  Allergies An allergy is an abnormal reaction to a substance by the body's defense system (immune system). Allergies can develop at any age. WHAT CAUSES ALLERGIES? An allergic reaction happens when the immune system mistakenly reacts to a normally harmless substance, called an allergen, as if it were harmful. The immune system releases antibodies to fight the substance. Antibodies eventually release a chemical called histamine into the bloodstream. The release of histamine is meant to protect the body from infection, but it also causes discomfort. An allergic reaction can be triggered by:  Eating an allergen.  Inhaling an allergen.  Touching an allergen. WHAT TYPES OF ALLERGIES ARE THERE? There are many types of allergies. Common types include:  Seasonal allergies. People with this type of allergy are usually allergic to substances that are only present during certain seasons, such as molds and pollens.  Food allergies.  Drug allergies.  Insect allergies.  Animal dander allergies. WHAT ARE SYMPTOMS OF ALLERGIES? Possible allergy symptoms include:  Swelling of the lips, face, tongue, mouth, or throat.  Sneezing, coughing, or wheezing.  Nasal congestion.  Tingling in the mouth.  Rash.  Itching.  Itchy, red, swollen areas of skin (hives).  Watery eyes.  Vomiting.  Diarrhea.  Dizziness.  Lightheadedness.  Fainting.  Trouble breathing or swallowing.  Chest tightness.  Rapid heartbeat. HOW ARE ALLERGIES DIAGNOSED? Allergies are diagnosed with a medical and family history and one or more of the following:  Skin tests.  Blood tests.  A food diary. A food diary is a record of all the foods and drinks you have in a day and of all the symptoms you experience.  The results of an elimination diet. An elimination diet involves eliminating foods from your diet and then adding them back  in one by one to find out if a certain food causes an allergic reaction. HOW ARE ALLERGIES TREATED? There is no cure for allergies, but allergic reactions can be treated with medicine. Severe reactions usually need to be treated at a hospital. HOW CAN REACTIONS BE PREVENTED? The best way to prevent an allergic reaction is by avoiding the substance you are allergic to. Allergy shots and medicines can also help prevent reactions in some cases. People with severe allergic reactions may be able to prevent a life-threatening reaction called anaphylaxis with a medicine given right after exposure to the allergen.   This information is not intended to replace advice given to you by your health care provider. Make sure you discuss any questions you have with your health care provider.   Document Released: 01/13/2003 Document Revised: 11/10/2014 Document Reviewed: 08/01/2014 Elsevier Interactive Patient Education 2016 [employer]     IF you received an x-ray today, you will receive an invoice from [facility]. Please contact [facility] at [phone] with questions or concerns regarding your invoice.   IF you received labwork today, you will receive an invoice from [facility]. Please contact [facility] at [phone] with questions or concerns regarding your invoice.   Our billing staff will not be able to assist you with questions regarding bills from these companies.  You will be contacted with the lab results as soon as they are available. The fastest way to get your results is to activate your My Chart account. Instructions are located on the last page of this paperwork. If you have not heard from us regarding the results in 2 weeks, please contact  this office.

## 2017-12-24 DIAGNOSIS — I10 Essential (primary) hypertension: Secondary | ICD-10-CM | POA: Diagnosis not present

## 2018-01-11 DIAGNOSIS — I10 Essential (primary) hypertension: Secondary | ICD-10-CM | POA: Diagnosis not present

## 2018-07-14 DIAGNOSIS — R972 Elevated prostate specific antigen [PSA]: Secondary | ICD-10-CM | POA: Diagnosis not present

## 2018-07-14 DIAGNOSIS — Z0000 Encounter for general adult medical examination without abnormal findings: Secondary | ICD-10-CM | POA: Diagnosis not present

## 2018-07-14 DIAGNOSIS — K5900 Constipation, unspecified: Secondary | ICD-10-CM | POA: Diagnosis not present

## 2018-07-14 DIAGNOSIS — I10 Essential (primary) hypertension: Secondary | ICD-10-CM | POA: Diagnosis not present

## 2018-07-14 DIAGNOSIS — Z23 Encounter for immunization: Secondary | ICD-10-CM | POA: Diagnosis not present

## 2018-07-14 DIAGNOSIS — E782 Mixed hyperlipidemia: Secondary | ICD-10-CM | POA: Diagnosis not present

## 2018-09-14 DIAGNOSIS — E782 Mixed hyperlipidemia: Secondary | ICD-10-CM | POA: Diagnosis not present

## 2018-09-14 DIAGNOSIS — I10 Essential (primary) hypertension: Secondary | ICD-10-CM | POA: Diagnosis not present

## 2018-09-26 ENCOUNTER — Emergency Department (HOSPITAL_COMMUNITY)
Admission: EM | Admit: 2018-09-26 | Discharge: 2018-09-26 | Disposition: A | Discharge status: Home / Self Care | Payer: 59 | Attending: Emergency Medicine | Admitting: Emergency Medicine

## 2018-09-26 ENCOUNTER — Emergency Department (HOSPITAL_COMMUNITY): Payer: 59

## 2018-09-26 ENCOUNTER — Encounter (HOSPITAL_COMMUNITY): Payer: Self-pay

## 2018-09-26 DIAGNOSIS — Z79899 Other long term (current) drug therapy: Secondary | ICD-10-CM | POA: Diagnosis not present

## 2018-09-26 DIAGNOSIS — Z87891 Personal history of nicotine dependence: Secondary | ICD-10-CM | POA: Insufficient documentation

## 2018-09-26 DIAGNOSIS — I10 Essential (primary) hypertension: Secondary | ICD-10-CM | POA: Diagnosis not present

## 2018-09-26 DIAGNOSIS — R55 Syncope and collapse: Secondary | ICD-10-CM | POA: Diagnosis present

## 2018-09-26 HISTORY — DX: Essential (primary) hypertension: I10

## 2018-09-26 LAB — CBC
HEMATOCRIT: 47 % (ref 39.0–52.0)
Hemoglobin: 15.2 g/dL (ref 13.0–17.0)
MCH: 28.8 pg (ref 26.0–34.0)
MCHC: 32.3 g/dL (ref 30.0–36.0)
MCV: 89.2 fL (ref 80.0–100.0)
PLATELETS: 215 10*3/uL (ref 150–400)
RBC: 5.27 MIL/uL (ref 4.22–5.81)
RDW: 12.7 % (ref 11.5–15.5)
WBC: 5 10*3/uL (ref 4.0–10.5)
nRBC: 0 % (ref 0.0–0.2)

## 2018-09-26 LAB — URINALYSIS, ROUTINE W REFLEX MICROSCOPIC
Bacteria, UA: NONE SEEN
Bilirubin Urine: NEGATIVE
GLUCOSE, UA: NEGATIVE mg/dL
Ketones, ur: NEGATIVE mg/dL
Leukocytes, UA: NEGATIVE
NITRITE: NEGATIVE
Protein, ur: NEGATIVE mg/dL
Specific Gravity, Urine: 1.017 (ref 1.005–1.030)
pH: 6 (ref 5.0–8.0)

## 2018-09-26 LAB — I-STAT TROPONIN, ED: Troponin i, poc: 0 ng/mL (ref 0.00–0.08)

## 2018-09-26 LAB — BASIC METABOLIC PANEL
Anion gap: 7 (ref 5–15)
BUN: 15 mg/dL (ref 6–20)
CHLORIDE: 104 mmol/L (ref 98–111)
CO2: 26 mmol/L (ref 22–32)
CREATININE: 1.11 mg/dL (ref 0.61–1.24)
Calcium: 9.8 mg/dL (ref 8.9–10.3)
GFR calc non Af Amer: >60 mL/min (ref >60)
Glucose, Bld: 93 mg/dL (ref 70–99)
Potassium: 4.2 mmol/L (ref 3.5–5.1)
SODIUM: 137 mmol/L (ref 135–145)

## 2018-09-26 MED ORDER — AMLODIPINE BESYLATE 5 MG PO TABS: 5.0000 mg | ORAL_TABLET | Freq: Every day | ORAL | 1 refills | Status: DC

## 2018-09-26 NOTE — Discharge Instructions (Addendum)
You have mild blood pressure, elevation. It does not appear to be causing any serious problems at this time.  We are changing you to a new blood pressure medicine which has been sent to your pharmacy.  Stop taking the HCTZ, for now.  Hang onto the prescription for HCTZ, because you may need to restart it as a secondary medicine if the new medicine does not work.  Make sure you try to stay on a low-salt diet.  It is safe to use Tylenol or Motrin if needed for headache.  Follow-up with your primary care doctor for blood pressure check in 1 or 2 weeks.  Return here, if needed, for problems.

## 2018-09-26 NOTE — ED Provider Notes (Signed)
[facility] EMERGENCY DEPARTMENT Provider Note   CSN: [id number] Arrival date & time: 09/26/18  1505     History   Chief Complaint No chief complaint on file.   HPI Eric Everett is a 57 y.o. male.  HPI   He is here for evaluation of high blood pressure with a period of near syncope today while he was sitting on a couch.  He is also had headache for several days.  He saw a doctor about 2 weeks ago for evaluation of headache and high blood pressure and was prescribed HCTZ, which he started yesterday, 25 mg daily, he has had 2 doses so far.  States he ate some breakfast earlier today prior to the episode of near syncope.  He denies chest pain, shortness of breath, cough, fever, chills, nausea, vomiting, focal weakness or paresthesia.  History of high blood pressure about a year ago treated for short period time with diuretic, he stopped it on his own because he thought he is doing better.  Checked his blood pressure several times in the last several months and found to be elevated.  For him pressure elevations are in the range of 180/100.  There are no other known modifying factors.  Past Medical History:  Diagnosis Date  . Allergy   . Arthritis    L4-5- HNP  . Hypertension   . No pertinent past medical history     Patient Active Problem List   Diagnosis Date Noted  . Lumbar disc herniation with radiculopathy 12/29/2011    Past Surgical History:  Procedure Laterality Date  . EYE SURGERY     1982, sports injury - R eye  . LAMINECTOMY AND MICRODISCECTOMY LUMBAR SPINE  12/29/11   right L4-5  . LUMBAR LAMINECTOMY/DECOMPRESSION MICRODISCECTOMY  12/29/2011   Procedure: LUMBAR LAMINECTOMY/DECOMPRESSION MICRODISCECTOMY 1 LEVEL;  Surgeon: Temple Pacini, MD;  Location: [facility];  Service: Neurosurgery;  Laterality: Right;  Right Lumbar Four-Five Laminectomy Microdiskectomy        Home Medications    Prior to Admission medications   Medication Sig Start Date  End Date Taking? Authorizing Provider  amLODipine (NORVASC) 5 MG tablet Take 1 tablet (5 mg total) by mouth daily. 09/26/18   Mancel Bale, MD  cetirizine (ZYRTEC) 10 MG tablet Take 1 tablet (10 mg total) by mouth daily. 06/18/16 06/25/16  Trena Platt D, PA  hydrocortisone (ANUSOL-HC) 25 MG suppository Place 1 suppository (25 mg total) rectally 2 (two) times daily. 06/18/16   Garnetta Buddy, PA  ranitidine (ZANTAC) 150 MG tablet Take 1 tablet (150 mg total) by mouth 2 (two) times daily. 06/18/16 06/25/16  Garnetta Buddy, PA    Family History Family History  Problem Relation Age of Onset  . Heart disease Father   . Cancer Sister   . Stroke Brother   . Anesthesia problems Neg Hx     Social History Social History   Tobacco Use  . Smoking status: Former Smoker    Packs/day: 0.50    Years: 20.00    Pack years: 10.00    Types: Cigarettes  . Smokeless tobacco: Never Used  . Tobacco comment: "no consult; I quit after today" (12/29/11)  Substance Use Topics  . Alcohol use: Yes    Comment: occassional  . Drug use: Yes    Types: "Crack" cocaine, Cocaine, Marijuana    Comment: no drug use isince 12/01/1994     Allergies   Dairy aid [lactase]   Review of Systems  Review of Systems  All other systems reviewed and are negative.    Physical Exam Updated Vital Signs BP (!) 162/91 (BP Location: Left Arm)   Pulse 62   Temp 98.5 F (36.9 C) (Oral)   Resp 17   Ht 6' (1.829 m)   Wt 91.6 kg   SpO2 98%   BMI 27.40 kg/m   Physical Exam  Constitutional: He is oriented to person, place, and time. He appears well-developed and well-nourished. No distress.  HENT:  Head: Normocephalic and atraumatic.  Right Ear: External ear normal.  Left Ear: External ear normal.  Eyes: Pupils are equal, round, and reactive to light. Conjunctivae and EOM are normal.  Neck: Normal range of motion and phonation normal. Neck supple.  Cardiovascular: Normal rate, regular rhythm and  normal heart sounds.  Pulmonary/Chest: Effort normal and breath sounds normal. He exhibits no bony tenderness.  Abdominal: Soft. There is no tenderness.  Musculoskeletal: Normal range of motion.  Neurological: He is alert and oriented to person, place, and time. No cranial nerve deficit or sensory deficit. He exhibits normal muscle tone. Coordination normal.  No dysarthria, aphasia or nystagmus.  Skin: Skin is warm, dry and intact.  Psychiatric: He has a normal mood and affect. His behavior is normal. Judgment and thought content normal.  Nursing note and vitals reviewed.    ED Treatments / Results  Labs (all labs ordered are listed, but only abnormal results are displayed) Labs Reviewed  URINALYSIS, ROUTINE W REFLEX MICROSCOPIC - Abnormal; Notable for the following components:      Result Value   Hgb urine dipstick MODERATE (*)    All other components within normal limits  BASIC METABOLIC PANEL  CBC  I-STAT TROPONIN, ED    EKG EKG Interpretation  Date/Time:  Sunday September 26 2018 15:23:25 EST Ventricular Rate:  59 PR Interval:    QRS Duration: 88 QT Interval:  409 QTC Calculation: 406 R Axis:   81 Text Interpretation:  Sinus rhythm Left ventricular hypertrophy Borderline T abnormalities, lateral leads ST elevation, consider anterior injury Since last tracing Nonspecific T wave abnormality now evident in Lateral leads Confirmed by Mancel BaleWentz, Zelda Reames [phone]) on 09/26/2018 3:38:49 PM Also confirmed by Mancel BaleWentz, Avarose Mervine [phone]), editor Elita QuickWatlington, Beverly ([id number])  on 09/27/2018 7:12:34 AM   Radiology Dg Chest 2 View  Result Date: 09/26/2018 CLINICAL DATA:  Elevated blood pressure with chest pain. Previous episode 6 months ago. EXAM: CHEST - 2 VIEW COMPARISON:  06/01/2009 and 10/23/2005. FINDINGS: The heart size and mediastinal contours are normal. The lungs are clear. There is no pleural effusion or pneumothorax. No acute osseous findings are identified. Telemetry leads overlie the  chest. There are mild degenerative changes throughout the spine. IMPRESSION: Stable chest.  No active cardiopulmonary process. Electronically Signed   By: Carey BullocksWilliam  Veazey M.D.   On: 09/26/2018 16:38   Ct Head Wo Contrast  Result Date: 09/26/2018 CLINICAL DATA:  Syncopal episode this afternoon. Headaches for several months. History of hypertension. EXAM: CT HEAD WITHOUT CONTRAST TECHNIQUE: Contiguous axial images were obtained from the base of the skull through the vertex without intravenous contrast. COMPARISON:  CT HEAD June 01, 2009 FINDINGS: BRAIN: No intraparenchymal hemorrhage, mass effect nor midline shift. The ventricles and sulci are normal. No acute large vascular territory infarcts. No abnormal extra-axial fluid collections. Basal cisterns are patent. VASCULAR: Unremarkable. SKULL/SOFT TISSUES: No skull fracture. No significant soft tissue swelling. ORBITS/SINUSES: The included ocular globes and orbital contents are normal.Trace paranasal sinus mucosal thickening. Mastoid air  cells are well aerated. OTHER: None. IMPRESSION: Normal noncontrast CT HEAD. Electronically Signed   By: Awilda Metro M.D.   On: 09/26/2018 16:58    Procedures Procedures (including critical care time)  Medications Ordered in ED Medications - No data to display   Initial Impression / Assessment and Plan / ED Course  I have reviewed the triage vital signs and the nursing notes.  Pertinent labs & imaging results that were available during my care of the patient were reviewed by me and considered in my medical decision making (see chart for details).      Patient Vitals for the past 24 hrs:  BP Temp Temp src Pulse Resp SpO2 Height Weight  09/26/18 1853 (!) 162/91 98.5 F (36.9 C) Oral 62 17 98 % - -  09/26/18 1534 - - - - - - 6' (1.829 m) 91.6 kg  09/26/18 1533 (!) 172/79 98.5 F (36.9 C) Oral (!) 59 19 96 % - -  09/26/18 1530 (!) 177/77 - - (!) 58 (!) 22 96 % - -    6:37 PM Reevaluation with update  and discussion. After initial assessment and treatment, an updated evaluation reveals he is comfortable has no additional complaints.  Findings discussed with patient and wife.  He would like to change to a new blood pressure medicine, since the HCTZ has not worked yet.  All questions answered. Mancel Bale   Medical Decision Making: Mild high blood pressure without signs for endorgan damage or hypertensive urgency.  Patient recently stopped smoking cigarettes and may be having some withdrawal symptoms contributing to headache.  He has trace mucosal thickening of the sinuses, paranasal, unlikely to be causing headache.  Doubt serious bacterial infection, metabolic instability or impending vascular collapse.  CRITICAL CARE-no Performed by: Mancel Bale  Nursing Notes Reviewed/ Care Coordinated Applicable Imaging Reviewed Interpretation of Laboratory Data incorporated into ED treatment  The patient appears reasonably screened and/or stabilized for discharge and I doubt any other medical condition or other [facility] requiring further screening, evaluation, or treatment in the ED at this time prior to discharge.  Plan: Home Medications-continue routine medicines; Home Treatments-low-sodium diet; return here if the recommended treatment, does not improve the symptoms; Recommended follow up-PCP checkup 1 to 2 weeks.    Final Clinical Impressions(s) / ED Diagnoses   Final diagnoses:  Hypertension, unspecified type    ED Discharge Orders         Ordered    amLODipine (NORVASC) 5 MG tablet  Daily     09/26/18 1840           Mancel Bale, MD 09/27/18 1208

## 2018-09-26 NOTE — ED Triage Notes (Signed)
Pt sent from u/c.  Onset 2pm pt "blacked out".   Pt went to u/c started having left sided chest pain, lasting 4-5 min .    Onset several months headaches and high BP.  Pt started BP medication yesterday.   Denies chest pain at this time.

## 2020-07-16 ENCOUNTER — Encounter: Payer: Self-pay | Admitting: Internal Medicine

## 2020-07-16 ENCOUNTER — Other Ambulatory Visit: Payer: Self-pay

## 2020-07-16 ENCOUNTER — Ambulatory Visit: Payer: 59 | Admitting: Internal Medicine

## 2020-07-16 VITALS — BP 130/80 | Ht 72.0 in | Wt 210.0 lb

## 2020-07-16 DIAGNOSIS — E7849 Other hyperlipidemia: Secondary | ICD-10-CM

## 2020-07-16 DIAGNOSIS — I10 Essential (primary) hypertension: Secondary | ICD-10-CM | POA: Diagnosis not present

## 2020-07-16 NOTE — Patient Instructions (Addendum)
Medication Instructions:  Your physician recommends that you continue on your current medications as directed. Please refer to the Current Medication list given to you today.  *If you need a refill on your cardiac medications before your next appointment, please call your pharmacy*   Lab Work: none If you have labs (blood work) drawn today and your tests are completely normal, you will receive your results only by: Marland Kitchen MyChart Message (if you have MyChart) OR . A paper copy in the mail If you have any lab test that is abnormal or we need to change your treatment, we will call you to review the results.   Testing/Procedures:  Your physician has requested that you have an echocardiogram. Echocardiography is a painless test that uses sound waves to create images of your heart. It provides your doctor with information about the size and shape of your heart and how well your heart's chambers and valves are working. This procedure takes approximately one hour. There are no restrictions for this procedure.   Follow-Up: At [facility], you and your health needs are our priority.  As part of our continuing mission to provide you with exceptional heart care, we have created designated Provider Care Teams.  These Care Teams include your primary Cardiologist (physician) and Advanced Practice Providers (APPs -  Physician Assistants and Nurse Practitioners) who all work together to provide you with the care you need, when you need it.  We recommend signing up for the patient portal called "MyChart".  Sign up information is provided on this After Visit Summary.  MyChart is used to connect with patients for Virtual Visits (Telemedicine).  Patients are able to view lab/test results, encounter notes, upcoming appointments, etc.  Non-urgent messages can be sent to your provider as well.   To learn more about what you can do with MyChart, go to [email or web address].    Your next appointment:   1  year(s)  The format for your next appointment:   In Person  Provider:   Izora Ribas   Other Instructions:   Need to bring your BP cuff to your Echocardiogram appointment so we can correlate at a nurse visit that same day.   HOW TO TAKE YOUR BLOOD PRESSURE:  Rest 5 minutes before taking your blood pressure.   Don't smoke or drink caffeinated beverages for at least 30 minutes before.  Take your blood pressure before (not after) you eat.  Sit comfortably with your back supported and both feet on the floor (don't cross your legs).  Elevate your arm to heart level on a table or a desk.  Use the proper sized cuff. It should fit smoothly and snugly around your bare upper arm. There should be enough room to slip a fingertip under the cuff. The bottom edge of the cuff should be 1 inch above the crease of the elbow.  Ideally, take 3 measurements at one sitting and record the average.

## 2020-07-16 NOTE — Progress Notes (Signed)
Cardiology Office Note:    Date:  07/16/2020   ID:  Eric Everett, DOB Mar 19, 1961, MRN [id number]  PCP:  Soundra Pilon, FNP  [facility] Cardiologist:  No primary care provider on file.  [facility] Electrophysiologist:  None   Referring MD: Ileana Ladd, MD   CC: EKG Questions and blood pressure management Seen for the evaluation of EKG changes at the behest of Peri Maris.  History of Present Illness:    Eric Everett is a 59 y.o. male with a hx of hypertension, hyperlipidemia and EKG changes of LVH.  Patient notes that he feels the same that he has for years.  Incidentally went to the dentist three weeks ago and prior to getting his teeth cleaned and had SBP of 220 mm Hg.  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 No chest pain, shortness of breath, DOE, PND.  Coaches football and basketball.  No syncope or near syncope.  No issues with BP medications, though notes Norvasc occasional gives him some stomach cramping.  Has been off medications for sometime; did not want to do virtual visit and well and was lost to follow up.  Past Medical History:  Diagnosis Date  . Allergy   . Arthritis    L4-5- HNP  . Chest pain   . Constipation   . Hyperlipidemia   . Hypertension   . No pertinent past medical history     Past Surgical History:  Procedure Laterality Date  . EYE SURGERY     1982, sports injury - R eye  . LAMINECTOMY AND MICRODISCECTOMY LUMBAR SPINE  12/29/11   right L4-5  . LUMBAR LAMINECTOMY/DECOMPRESSION MICRODISCECTOMY  12/29/2011   Procedure: LUMBAR LAMINECTOMY/DECOMPRESSION MICRODISCECTOMY 1 LEVEL;  Surgeon: Temple Pacini, MD;  Location: [facility];  Service: Neurosurgery;  Laterality: Right;  Right Lumbar Four-Five Laminectomy Microdiskectomy   Current Medications: Current Meds  Medication Sig  . amLODipine (NORVASC) 5 MG tablet Take 5 mg by mouth in the morning and at bedtime.  Marland Kitchen  atorvastatin (LIPITOR) 20 MG tablet Take 20 mg by mouth daily.     Allergies:   Dairy aid [lactase] and Other   Social History   Socioeconomic History  . Marital status: Married    Spouse name: Not on file  . Number of children: Not on file  . Years of education: Not on file  . Highest education level: Not on file  Occupational History  . Not on file  Tobacco Use  . Smoking status: Former Smoker    Packs/day: 0.50    Years: 20.00    Pack years: 10.00    Types: Cigarettes  . Smokeless tobacco: Never Used  . Tobacco comment: "no consult; I quit after today" (12/29/11)  Substance and Sexual Activity  . Alcohol use: Yes    Comment: occassional  . Drug use: Yes    Types: "Crack" cocaine, Cocaine, Marijuana    Comment: no drug use isince 12/01/1994  . Sexual activity: Never  Other Topics Concern  . Not on file  Social History Narrative  . Not on file   Social Determinants of Health   Financial Resource Strain:   . Difficulty of Paying Living Expenses: Not on file  Food Insecurity:   . Worried About Programme researcher, broadcasting/film/video in the Last Year: Not on file  . Ran Out of Food in the Last Year: Not on  file  Transportation Needs:   . Freight forwarder (Medical): Not on file  . Lack of Transportation (Non-Medical): Not on file  Physical Activity:   . Days of Exercise per Week: Not on file  . Minutes of Exercise per Session: Not on file  Stress:   . Feeling of Stress : Not on file  Social Connections:   . Frequency of Communication with Friends and Family: Not on file  . Frequency of Social Gatherings with Friends and Family: Not on file  . Attends Religious Services: Not on file  . Active Member of Clubs or Organizations: Not on file  . Attends Banker Meetings: Not on file  . Marital Status: Not on file    Family History: The patient's family history includes Cancer in his sister; Heart disease in his father; Stroke in his brother. There is no history of  Anesthesia problems. Father had COPD and may have had   ROS:   Please see the history of present illness.    All other systems reviewed and are negative.  EKGs/Labs/Other Studies Reviewed:    The following studies were reviewed today:  EKG:  EKG is ordered today.  The ekg ordered today demonstrates sinus rhythm rate 68.  Less evidence of LVH 09/27/18- sinus bradycardia with LVH OSH Labs Random Glucose 134 Creatine 1.2 TSH 1.6  Physical Exam:    VS:  BP 130/80 (BP Location: Right Arm, Patient Position: Sitting, Cuff Size: Normal)   Ht 6' (1.829 m)   Wt 210 lb (95.3 kg)   SpO2 97%   BMI 28.48 kg/m     Wt Readings from Last 3 Encounters:  07/16/20 210 lb (95.3 kg)  09/26/18 202 lb (91.6 kg)  06/18/16 210 lb (95.3 kg)     GEN: Well nourished, well developed in no acute distress HEENT: Normal NECK: No JVD; No carotid bruits LYMPHATICS: No lymphadenopathy CARDIAC: RRR, II/VIsystolic heart murmur without change in hand grip or valsalva and best heard at RUSB, No rubs, gallops RESPIRATORY:  Clear to auscultation without rales, wheezing or rhonchi  ABDOMEN: Soft, non-tender, non-distended MUSCULOSKELETAL:  No edema; No deformity  SKIN: Warm and dry NEUROLOGIC:  Alert and oriented x 3 PSYCHIATRIC:  Normal affect   ASSESSMENT:    1. Essential hypertension   2. Other hyperlipidemia    PLAN:    In order of problems listed above:  1. Hypertension - on norvasc 5 mg; continue ambulatory blood pressure monitoring; will set up nurse visit for BP and cuff correlation  2. Hyperlipidemia - continue current mgmt (start date today and answered questions about meds) 3. Hyperglycemia  - will monitor 4. LV hypertrophy on ECG with systolic heart Murmur - asymptomatic from LVH, unclear family history; will get TTE  See in one year unless new symptoms occur  Medication Adjustments/Labs and Tests Ordered: Current medicines are reviewed at length with the patient today.  Concerns  regarding medicines are outlined above.  No orders of the defined types were placed in this encounter.  No orders of the defined types were placed in this encounter.   There are no Patient Instructions on file for this visit.   Signed, Christell Constant, MD  07/16/2020 2:18 PM    [facility]

## 2020-07-20 NOTE — Addendum Note (Signed)
Addended by: Kerrie Buffalo on: 07/20/2020 11:17 AM   Modules accepted: Orders

## 2020-08-01 ENCOUNTER — Other Ambulatory Visit (HOSPITAL_COMMUNITY): Payer: 59

## 2020-08-01 ENCOUNTER — Ambulatory Visit: Payer: 59

## 2020-08-01 ENCOUNTER — Encounter (HOSPITAL_COMMUNITY): Payer: Self-pay | Admitting: Internal Medicine

## 2020-08-01 NOTE — Progress Notes (Deleted)
Patient ID: Eric Everett                 DOB: [birth year]                      MRN: [id number]     HPI: Eric Everett is a 59 y.o. male referred by Dr. Izora Everett to HTN clinic. PMH is significant for HTN, HLD, and LVH. He went to the dentist a month ago and SBP was elevated at . BP was 130/56mmHg at last office visit 2 weeks ago. Pt was referred to HTN clinic and advised to bring in home cuff for calibration today, also here for echo as well.  Stomach cramping on amlodipine? Any LEE? Any other BP meds in the past? Start valsartan if needed  Current HTN meds: amlodipine 5mg  BID Previously tried: amlodipine - stomach cramping BP goal: <130/47mmHg  Family History: Cancer in his sister; Heart disease and COPD in his father; Stroke in his brother.   Social History: Former smoker 1/2 PPD for 20 years. Former cocaine and marijuana use, none since 1996. Occasional alcohol use.  Diet:   Exercise: Coaches football and basketball.  Home BP readings:   Labs: 06/29/20 [place]: SCr 1.2, K 4.5, Na 139  Wt Readings from Last 3 Encounters:  07/16/20 210 lb (95.3 kg)  09/26/18 202 lb (91.6 kg)  06/18/16 210 lb (95.3 kg)   BP Readings from Last 3 Encounters:  07/16/20 130/80  09/26/18 (!) 162/91  06/18/16 134/76   Pulse Readings from Last 3 Encounters:  09/26/18 62  06/18/16 63  12/29/11 64    Renal function: CrCl cannot be calculated (Patient's most recent lab result is older than the maximum 21 days allowed.).  Past Medical History:  Diagnosis Date  . Allergy   . Arthritis    L4-5- HNP  . Chest pain   . Constipation   . Hyperlipidemia   . Hypertension   . No pertinent past medical history     Current Outpatient Medications on File Prior to Visit  Medication Sig Dispense Refill  . amLODipine (NORVASC) 5 MG tablet Take 5 mg by mouth in the morning and at bedtime.    12/31/11 atorvastatin (LIPITOR) 20 MG tablet Take 20 mg by mouth daily.      No current  facility-administered medications on file prior to visit.    Allergies  Allergen Reactions  . Dairy Aid [Lactase]   . Other     nuts     Assessment/Plan:  1. Hypertension -

## 2020-08-06 ENCOUNTER — Other Ambulatory Visit: Payer: Self-pay

## 2020-08-06 ENCOUNTER — Ambulatory Visit (INDEPENDENT_AMBULATORY_CARE_PROVIDER_SITE_OTHER): Payer: 59 | Admitting: Pharmacist

## 2020-08-06 DIAGNOSIS — I10 Essential (primary) hypertension: Secondary | ICD-10-CM | POA: Diagnosis not present

## 2020-08-06 NOTE — Progress Notes (Deleted)
Patient ID: Eric Everett                 DOB: Feb 18, 1961                      MRN: [id number]     HPI: Eric Everett is a 59 y.o. male referred by Dr. Izora Ribas to HTN clinic. PMH is significant for hypertension, hyperlipidemia and EKG changes of LVH. Patient went to dentist for teeth cleaning and blood pressure was 220 systolic.   Na intake (fast food, canned foods, add salt, cook w/ salt?) Decongestants, NSAIDs, steroids Exercise Sleep?   Scr 1.2 K4.5 06/29/20   Current HTN meds: amlodipine 5mg   Previously tried: HCTZ, lisinpril/HCTZ BP goal: <130/80  Family History:   Social History: former smoker (  Diet:   Exercise:   Home BP readings:   Wt Readings from Last 3 Encounters:  07/16/20 210 lb (95.3 kg)  09/26/18 202 lb (91.6 kg)  06/18/16 210 lb (95.3 kg)   BP Readings from Last 3 Encounters:  07/16/20 130/80  09/26/18 (!) 162/91  06/18/16 134/76   Pulse Readings from Last 3 Encounters:  09/26/18 62  06/18/16 63  12/29/11 64    Renal function: CrCl cannot be calculated (Patient's most recent lab result is older than the maximum 21 days allowed.).  Past Medical History:  Diagnosis Date  . Allergy   . Arthritis    L4-5- HNP  . Chest pain   . Constipation   . Hyperlipidemia   . Hypertension   . No pertinent past medical history     Current Outpatient Medications on File Prior to Visit  Medication Sig Dispense Refill  . amLODipine (NORVASC) 5 MG tablet Take 5 mg by mouth in the morning and at bedtime.    12/31/11 atorvastatin (LIPITOR) 20 MG tablet Take 20 mg by mouth daily.      No current facility-administered medications on file prior to visit.    Allergies  Allergen Reactions  . Dairy Aid [Lactase]   . Other     nuts     Assessment/Plan:  1. Hypertension -   Marland Kitchen, Pharm.D, BCPS, CPP [facility]  [address] [place], [place]  Phone: [phone]; Fax: [phone]

## 2020-08-06 NOTE — Progress Notes (Addendum)
  Patient ID: Eric Cumpian                  DOB: [birth year]                      MRN: [id number]     HPI: Eric Everett is a 59 y.o. male referred by Dr. Izora Ribas to HTN clinic. PMH is significant for hypertension, hyperlipidemiaand EKG changes of LVH. Patient went to dentist for teeth cleaning and blood pressure was 220 systolic.   Today, patient presents to clinic in good spirits. Reports being off medications for some time after refills ran out and just didn't get them filled again. He has been on amlodipine for approximately 2 months now. He does not use decongestants, NSAIDs, or steroids. At home his BP runs 140-149/60s and HR normal (60-90s). He brought in his home BP cuff for calibration, which appeared to be inaccurately reading high - BP 160/86, HR 62. With the office manual cuff he was found to have a HR 68, BP 140/80. The office automatic cuff showed a BP of 134/75. He still occasionally smokes cigarettes/cigars socially and has a few alcoholic drinks every other week. He drinks about 2 cups of coffee a day but no other caffinated beverages.   Current HTN meds: amlodipine 5mg   Previously tried: HCTZ, lisinpril/HCTZ BP goal: <130/80  Family History:   Social History: Occasionally smokes cigarettes or cigars socially.  Sleeps about 7-8 hours a night, usually uninterrupted. Reports having night sweats before previous MD visit when BP was very high, but this has since mostly resolved.  Diet:  Eats out about once a week, not fast food.  No canned food or frozen dinners. His wife likes to keep the family eating healthy.  Little to no salt in cooking, no added salt at table.  About 2 cups of coffee a day, does not drink soda, tea, or energy drinks regularly.  Has a few (2-4) drinks of liquor about every other week when he goes out with friends but does not drink alcohol daily.   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  Home BP readings: 140-149/60s  Current Outpatient Medications  Medication Sig Dispense Refill  . amLODipine (NORVASC) 5 MG tablet Take 5 mg by mouth in the morning and at bedtime.    atorvastatin (LIPITOR) 20 MG tablet Take 20 mg by mouth daily.      No current facility-administered medications for this visit.    Assessment/Plan:  1. Hypertension - Today, patient's blood pressure was above goal of <130/80 mmHg. His home blood pressure machine appears to be showing higher values compared to our clinic readings. We recommended obtaining a new machine, such as the Omron Bronze Upper Arm or Omron Three Series. Encouraged the patient to check his blood pressures daily and keep a written log of values to bring to his next clinic visit. Plan to continue amlodipine 5mg  daily until we have several days of home readings to assess. Encouraged patient to increase weekly exercise and continue his current diet.   Marland Kitchen, PharmD PGY1 Acute Care Pharmacy Resident

## 2020-08-06 NOTE — Patient Instructions (Addendum)
Thank you for visiting [place] today!  Today your blood pressure was 140-150/80 which is above your goal of <130/80.   Your home blood pressure cuff appears to be inaccurate when compared to our office readings. We recommend obtaining a new machine, such as the Omron Bronze Upper Arm or Omron Three Series.   Please continue to check your blood pressures daily and keep a written log of your values to bring to your next clinic visit.   Keep up the great work with your diet and sleep! As discussed today, try to increase your weekly exercise as you are able.   For now, we will have you continue your current medications. Once you have checked your blood pressure for a few weeks, we will consider adjusting your blood pressure medications.   Medications Continue amlodipine 5 mg once daily  We will see you in clinic again on Friday, October 15th at 8:30am.

## 2020-08-13 ENCOUNTER — Telehealth (HOSPITAL_COMMUNITY): Payer: Self-pay | Admitting: Internal Medicine

## 2020-08-13 NOTE — Telephone Encounter (Signed)
Will send this message to the ordering Provider, as a general FYI.

## 2020-08-13 NOTE — Telephone Encounter (Signed)
Just an FYI. We have made several attempts to contact this patient including sending a letter to schedule or reschedule their echocardiogram. We will be removing the patient from the echo WQ.   08/01/2020 PT No Showed - MAILED LETTER/LBW  08/01/20 called and VM is full at 11:09/LBW     Thank you

## 2020-08-17 ENCOUNTER — Ambulatory Visit: Payer: 59

## 2020-08-17 NOTE — Progress Notes (Deleted)
  Patient ID: Eric Urieta                  DOB: 12/24/1960                      MRN: [id number]     HPI: Eric Everett is a 59 y.o. male referred by Dr. Izora Everett to HTN clinic. PMH is significant for hypertension, hyperlipidemiaand EKG changes of LVH. Patient went to dentist for teeth cleaning and blood pressure was 220 systolic. He has since been restarted on amlodipine 5mg  daily.  At last visit, his home meter was calibrated and was found to be higher than clinic cuff. We suggested patient purchase a new meter, ideally an upper arm monitor. No changes were made at last visit, due to unreliable home readings. Patient was encouraged to to increase his weekly exercise.  Today, patient presents to clinic in good spirits.  Increase amlodipine vs starting thiazide Dizziness, lightheadedness, headache, blurred vision, SOB, swelling Increase walking? Med rec New monitor?  Current HTN meds: amlodipine 5mg   Previously tried: HCTZ, lisinpril/HCTZ BP goal: <130/80  Family History:   Social History:  Sleeps about 7-8 hours a night, usually uninterrupted. Reports having night sweats before previous MD visit when BP was very high, but this has since mostly resolved. Still occasionally smokes cigarettes/cigars socially and has a few alcoholic drinks every other week.  Diet:  Eats out about once a week, not fast food.  No canned food or frozen dinners. His wife likes to keep the family eating healthy.  Little to no salt in cooking, no added salt at table.  About 2 cups of coffee a day, does not drink soda, tea, or energy drinks regularly.  Has a few (2-4) drinks of liquor about every other week when he goes out with friends but does not drink alcohol daily.   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  Home BP readings: 140-149/60s  Current Outpatient Medications  Medication Sig Dispense Refill  .  amLODipine (NORVASC) 5 MG tablet Take 5 mg by mouth in the morning and at bedtime.    atorvastatin (LIPITOR) 20 MG tablet Take 20 mg by mouth daily.      No current facility-administered medications for this visit.    Assessment/Plan:  1. Hypertension - Today, patient's blood pressure was above goal of <130/80 mmHg. His home blood pressure machine appears to be showing higher values compared to our clinic readings. We recommended obtaining a new machine, such as the Omron Bronze Upper Arm or Omron Three Series. Encouraged the patient to check his blood pressures daily and keep a written log of values to bring to his next clinic visit. Plan to continue amlodipine 5mg  daily until we have several days of home readings to assess. Encouraged patient to increase weekly exercise and continue his current diet.   , PharmD PGY1 Acute Care Pharmacy Resident

## 2021-06-25 ENCOUNTER — Ambulatory Visit
Admission: RE | Admit: 2021-06-25 | Discharge: 2021-06-25 | Disposition: A | Discharge status: Home / Self Care | Payer: 59 | Source: Ambulatory Visit | Attending: Sports Medicine | Admitting: Sports Medicine

## 2021-06-25 ENCOUNTER — Other Ambulatory Visit: Payer: Self-pay

## 2021-06-25 ENCOUNTER — Ambulatory Visit (INDEPENDENT_AMBULATORY_CARE_PROVIDER_SITE_OTHER): Payer: 59 | Admitting: Sports Medicine

## 2021-06-25 VITALS — Ht 72.0 in | Wt 210.0 lb

## 2021-06-25 DIAGNOSIS — M5412 Radiculopathy, cervical region: Secondary | ICD-10-CM

## 2021-06-25 MED ORDER — PREDNISONE 10 MG PO TABS: ORAL_TABLET | ORAL | 0 refills | Status: DC

## 2021-06-26 NOTE — Progress Notes (Signed)
   Subjective:    Patient ID: Eric Everett, male    DOB: January 14, 1961, 60 y.o.   MRN: [id number]  HPI chief complaint: Right-sided neck and arm pain  Very pleasant 60 year old ambidextrous male presents today complaining of 3 months of right-sided neck and arm pain.  He believes his pain may have begun after working out.  He describes an achy discomfort that begins in the right side of his neck and will radiate down the arm into the radial aspect of the right hand.  His symptoms are much worse with sitting and with driving a car.  Symptoms improved with walking and standing.  He also endorses some numbness and weakness in the right arm.  He is having some difficulty sleeping at night.  He has not had any previous neck surgery but he did undergo a single level lumbar laminectomy/decompression, microdiscectomy in 2013.  His current symptoms in his right arm are similar to what he experienced in his leg at that time.  He notes that a single 800 mg ibuprofen does help.  Past medical history reviewed Medications reviewed Allergies reviewed    Review of Systems As above    Objective:   Physical Exam  Well-developed, well-nourished.  No acute distress.  Cervical spine: Good cervical range of motion with a positive Spurling's to the right.  No tenderness along his cervical midline.  No spasm.  No atrophy.  There is decree sensation along the C6 dermatome in the right arm.  Trace reflexes at the biceps and brachial radialis tendons on the right compared to 1/4 on the left.  1/4 triceps reflexes bilaterally.  Good strength.  Good grip.  Good pulses.      Assessment & Plan:   Right-sided neck and arm pain secondary to cervical radiculopathy Status post remote single level lumbar spine laminectomy/decompression/microdiscectomy  Although the patient has not had cervical radiculopathy in the past, he did have lumbar radiculopathy which required surgical decompression.  Since his symptoms are the same  as what he felt in his leg previously, we are going to evaluate this further with x-rays and an MRI.  We will likely plan on a diagnostic/therapeutic cervical ESI once the MRI is done.  Phone follow-up with him with those results when available.  In the meantime, I did give him a prescription for a 6-day Sterapred Dosepak.  Although he tells me he is not diabetic, he did say that his PCP told him that his A1c was slightly elevated recently.  I do not have access to those records so I've asked him to check with his PCP first before starting his steroids.

## 2021-07-01 ENCOUNTER — Telehealth: Payer: Self-pay | Admitting: Sports Medicine

## 2021-07-01 NOTE — Telephone Encounter (Signed)
  I spoke with Mr. Podesta on the phone today after reviewing x-rays of his cervical spine.  When compared to an MRI from 2020 the degenerative changes seen are stable.  I was also able to retrieve the MRI of his cervical spine done in 2020 by Dr. Yevette Edwards.  That scan showed multilevel spondylosis worse at C5-C6 where he has moderately severe bilateral foraminal narrowing.  I believe this is the level at which he is experiencing his right arm radiculopathy.  He has yet to start his 6-day Sterapred Dosepak.  He was waiting to hear back from his PCP.  His plan is to stop by his office today to ensure that it is okay for him to take the oral steroids.  If so, he will start the Dosepak as directed.  I have asked him to call me later this week with an update.  If his symptoms persist despite oral steroids then I would consider a diagnostic/therapeutic cervical ESI at [facility].  Of note, since Mr. Mauch had a cervical spine MRI done in 2020 I do not believe we need to reorder that study at this time.

## 2021-07-11 ENCOUNTER — Other Ambulatory Visit: Payer: Self-pay | Admitting: Sports Medicine

## 2021-07-11 ENCOUNTER — Other Ambulatory Visit: Payer: Self-pay

## 2021-07-11 DIAGNOSIS — M5412 Radiculopathy, cervical region: Secondary | ICD-10-CM

## 2021-07-11 NOTE — Progress Notes (Signed)
Pt called stating the prednisone has somewhat. He is still taking pain meds to help rt arm pain. He is asking for next steps.  Per Dr. Margaretha Sheffield- will set up a cervical ESI at [facility]. Pt will be contacted to schedule an appt.

## 2021-07-17 ENCOUNTER — Ambulatory Visit
Admission: RE | Admit: 2021-07-17 | Discharge: 2021-07-17 | Disposition: A | Discharge status: Home / Self Care | Payer: 59 | Source: Ambulatory Visit | Attending: Sports Medicine | Admitting: Sports Medicine

## 2021-07-17 ENCOUNTER — Other Ambulatory Visit: Payer: Self-pay

## 2021-07-17 DIAGNOSIS — M5412 Radiculopathy, cervical region: Secondary | ICD-10-CM

## 2021-07-17 MED ORDER — IOPAMIDOL (ISOVUE-M 300) INJECTION 61%
1.0000 mL | Freq: Once | INTRAMUSCULAR | Status: AC
  Administered 2021-07-17: 1 mL via EPIDURAL

## 2021-07-17 MED ORDER — TRIAMCINOLONE ACETONIDE 40 MG/ML IJ SUSP (RADIOLOGY)
60.0000 mg | Freq: Once | INTRAMUSCULAR | Status: AC
  Administered 2021-07-17: 60 mg via EPIDURAL

## 2021-07-17 NOTE — Discharge Instructions (Signed)

## 2021-07-30 ENCOUNTER — Ambulatory Visit: Payer: 59 | Admitting: Sports Medicine

## 2021-08-09 ENCOUNTER — Other Ambulatory Visit: Payer: Self-pay | Admitting: Orthopedic Surgery

## 2021-08-09 DIAGNOSIS — M542 Cervicalgia: Secondary | ICD-10-CM

## 2021-08-22 ENCOUNTER — Other Ambulatory Visit: Payer: Self-pay

## 2021-08-22 ENCOUNTER — Ambulatory Visit
Admission: RE | Admit: 2021-08-22 | Discharge: 2021-08-22 | Disposition: A | Discharge status: Home / Self Care | Payer: 59 | Source: Ambulatory Visit | Attending: Orthopedic Surgery | Admitting: Orthopedic Surgery

## 2021-08-22 DIAGNOSIS — M542 Cervicalgia: Secondary | ICD-10-CM

## 2021-09-09 ENCOUNTER — Other Ambulatory Visit: Payer: Self-pay | Admitting: Orthopedic Surgery

## 2021-09-17 NOTE — Progress Notes (Signed)
Surgical Instructions    Your procedure is scheduled on Wednesday, November 23rd, 2022.   Report to [facility] Main Entrance "A" at 08:30 A.M., then check in with the Admitting office.  Call this number if you have problems the morning of surgery:  [phone]   If you have any questions prior to your surgery date call [phone]: Open Monday-Friday 8am-4pm    Remember:  Do not eat after midnight the night before your surgery  You may drink clear liquids until 08:30 the morning of your surgery.   Clear liquids allowed are: Water, Non-Citrus Juices (without pulp), Carbonated Beverages, Clear Tea, Black Coffee ONLY (NO MILK, CREAM OR POWDERED CREAMER of any kind), and Gatorade  Patient Instructions  The night before surgery:  No food after midnight. ONLY clear liquids after midnight  The day of surgery (if you do NOT have diabetes):  Drink ONE (1) Pre-Surgery Clear Ensure by 08:30 the morning of surgery. Drink in one sitting. Do not sip.  This drink was given to you during your hospital  pre-op appointment visit.  Nothing else to drink after completing the  Pre-Surgery Clear Ensure.          If you have questions, please contact your surgeon's office.     Take these medicines the morning of surgery with A SIP OF WATER:   amLODipine (NORVASC)    acetaminophen-codeine (TYLENOL #3) - if needed HYDROcodone-acetaminophen (NORCO/VICODIN) - if needed  As of today, STOP taking any Aspirin (unless otherwise instructed by your surgeon) Aleve, Naproxen, Ibuprofen, Motrin, Advil, Goody's, BC's, all herbal medications, fish oil, and all vitamins.    After your COVID test   You are not required to quarantine however you are required to wear a well-fitting mask when you are out and around people not in your household.  If your mask becomes wet or soiled, replace with a new one.  Wash your hands often with soap and water for 20 seconds or clean your hands with an alcohol-based hand  sanitizer that contains at least 60% alcohol.  Do not share personal items.  Notify your provider: if you are in close contact with someone who has COVID  or if you develop a fever of 100.4 or greater, sneezing, cough, sore throat, shortness of breath or body aches.    The day of surgery:          Do not wear jewelry  Do not wear lotions, powders, colognes, or deodorant. Men may shave face and neck. Do not bring valuables to the hospital.              [facility] is not responsible for any belongings or valuables.  Do NOT Smoke (Tobacco/Vaping)  24 hours prior to your procedure  If you use a CPAP at night, you may bring your mask for your overnight stay.   Contacts, glasses, hearing aids, dentures or partials may not be worn into surgery, please bring cases for these belongings   For patients admitted to the hospital, discharge time will be determined by your treatment team.   Patients discharged the day of surgery will not be allowed to drive home, and someone needs to stay with them for 24 hours.  NO VISITORS WILL BE ALLOWED IN PRE-OP WHERE PATIENTS ARE PREPPED FOR SURGERY.  ONLY 1 SUPPORT PERSON MAY BE PRESENT IN THE WAITING ROOM WHILE YOU ARE IN SURGERY.  IF YOU ARE TO BE ADMITTED, ONCE YOU ARE IN YOUR ROOM YOU WILL BE ALLOWED  TWO (2) VISITORS. 1 (ONE) VISITOR MAY STAY OVERNIGHT BUT MUST ARRIVE TO THE ROOM BY 8pm.  Minor children may have two parents present. Special consideration for safety and communication needs will be reviewed on a case by case basis.  Special instructions:    Oral Hygiene is also important to reduce your risk of infection.  Remember - BRUSH YOUR TEETH THE MORNING OF SURGERY WITH YOUR REGULAR TOOTHPASTE   [place]- Preparing For Surgery  Before surgery, you can play an important role. Because skin is not sterile, your skin needs to be as free of germs as possible. You can reduce the number of germs on your skin by washing with CHG (chlorahexidine  gluconate) Soap before surgery.  CHG is an antiseptic cleaner which kills germs and bonds with the skin to continue killing germs even after washing.     Please do not use if you have an allergy to CHG or antibacterial soaps. If your skin becomes reddened/irritated stop using the CHG.  Do not shave (including legs and underarms) for at least 48 hours prior to first CHG shower. It is OK to shave your face.  Please follow these instructions carefully.     Shower the NIGHT BEFORE SURGERY and the MORNING OF SURGERY with CHG Soap.   If you chose to wash your hair, wash your hair first as usual with your normal shampoo. After you shampoo, rinse your hair and body thoroughly to remove the shampoo.  Then [employer] and genitals (private parts) with your normal soap and rinse thoroughly to remove soap.  After that Use CHG Soap as you would any other liquid soap. You can apply CHG directly to the skin and wash gently with a scrungie or a clean washcloth.   Apply the CHG Soap to your body ONLY FROM THE NECK DOWN.  Do not use on open wounds or open sores. Avoid contact with your eyes, ears, mouth and genitals (private parts). Wash Face and genitals (private parts)  with your normal soap.   Wash thoroughly, paying special attention to the area where your surgery will be performed.  Thoroughly rinse your body with warm water from the neck down.  DO NOT shower/wash with your normal soap after using and rinsing off the CHG Soap.  Pat yourself dry with a CLEAN TOWEL.  Wear CLEAN PAJAMAS to bed the night before surgery  Place CLEAN SHEETS on your bed the night before your surgery  DO NOT SLEEP WITH PETS.   Day of Surgery:  Take a shower with CHG soap. Wear Clean/Comfortable clothing the morning of surgery Do not apply any deodorants/lotions.   Remember to brush your teeth WITH YOUR REGULAR TOOTHPASTE.   Please read over the following fact sheets that you were given.

## 2021-09-18 ENCOUNTER — Encounter (HOSPITAL_COMMUNITY)
Admission: RE | Admit: 2021-09-18 | Discharge: 2021-09-18 | Disposition: A | Discharge status: Home / Self Care | Payer: 59 | Source: Ambulatory Visit | Attending: Orthopedic Surgery | Admitting: Orthopedic Surgery

## 2021-09-18 ENCOUNTER — Other Ambulatory Visit: Payer: Self-pay

## 2021-09-18 ENCOUNTER — Encounter (HOSPITAL_COMMUNITY): Payer: Self-pay

## 2021-09-18 VITALS — BP 152/84 | HR 69 | Temp 98.5°F | Resp 17 | Ht 72.0 in | Wt 211.3 lb

## 2021-09-18 DIAGNOSIS — Z01818 Encounter for other preprocedural examination: Secondary | ICD-10-CM | POA: Diagnosis present

## 2021-09-18 LAB — TYPE AND SCREEN
ABO/RH(D): O NEG
Antibody Screen: NEGATIVE

## 2021-09-18 LAB — COMPREHENSIVE METABOLIC PANEL
ALT: 22 U/L (ref 0–44)
AST: 18 U/L (ref 15–41)
Albumin: 3.9 g/dL (ref 3.5–5.0)
Alkaline Phosphatase: 71 U/L (ref 38–126)
Anion gap: 7 (ref 5–15)
BUN: 10 mg/dL (ref 8–23)
CO2: 30 mmol/L (ref 22–32)
Calcium: 9.9 mg/dL (ref 8.9–10.3)
Chloride: 101 mmol/L (ref 98–111)
Creatinine, Ser: 1.09 mg/dL (ref 0.61–1.24)
GFR, Estimated: >60 mL/min (ref >60)
Glucose, Bld: 147 mg/dL — ABNORMAL HIGH (ref 70–99)
Potassium: 4.4 mmol/L (ref 3.5–5.1)
Sodium: 138 mmol/L (ref 135–145)
Total Bilirubin: 0.6 mg/dL (ref 0.3–1.2)
Total Protein: 6.9 g/dL (ref 6.5–8.1)

## 2021-09-18 LAB — CBC WITH DIFFERENTIAL/PLATELET
Abs Immature Granulocytes: 0.11 10*3/uL — ABNORMAL HIGH (ref 0.00–0.07)
Basophils Absolute: 0 10*3/uL (ref 0.0–0.1)
Basophils Relative: 1 %
Eosinophils Absolute: 0.1 10*3/uL (ref 0.0–0.5)
Eosinophils Relative: 1 %
HCT: 45.4 % (ref 39.0–52.0)
Hemoglobin: 14.8 g/dL (ref 13.0–17.0)
Immature Granulocytes: 1 %
Lymphocytes Relative: 21 %
Lymphs Abs: 1.7 10*3/uL (ref 0.7–4.0)
MCH: 29.6 pg (ref 26.0–34.0)
MCHC: 32.6 g/dL (ref 30.0–36.0)
MCV: 90.8 fL (ref 80.0–100.0)
Monocytes Absolute: 0.6 10*3/uL (ref 0.1–1.0)
Monocytes Relative: 8 %
Neutro Abs: 5.4 10*3/uL (ref 1.7–7.7)
Neutrophils Relative %: 68 %
Platelets: 231 10*3/uL (ref 150–400)
RBC: 5 MIL/uL (ref 4.22–5.81)
RDW: 13.3 % (ref 11.5–15.5)
WBC: 8 10*3/uL (ref 4.0–10.5)
nRBC: 0 % (ref 0.0–0.2)

## 2021-09-18 LAB — URINALYSIS, ROUTINE W REFLEX MICROSCOPIC
Bacteria, UA: NONE SEEN
Bilirubin Urine: NEGATIVE
Glucose, UA: NEGATIVE mg/dL
Hgb urine dipstick: NEGATIVE
Ketones, ur: NEGATIVE mg/dL
Leukocytes,Ua: NEGATIVE
Nitrite: NEGATIVE
Protein, ur: 30 mg/dL — AB
Specific Gravity, Urine: 1.024 (ref 1.005–1.030)
pH: 5 (ref 5.0–8.0)

## 2021-09-18 LAB — APTT: aPTT: 29 seconds (ref 24–36)

## 2021-09-18 LAB — PROTIME-INR
INR: 0.9 (ref 0.8–1.2)
Prothrombin Time: 12.5 seconds (ref 11.4–15.2)

## 2021-09-18 LAB — SURGICAL PCR SCREEN
MRSA, PCR: NEGATIVE
Staphylococcus aureus: NEGATIVE

## 2021-09-18 NOTE — Progress Notes (Signed)
PCP - Peri Maris, FNP Cardiologist - denies  PPM/ICD - denies   Chest x-ray - 09/26/18 EKG - 09/18/21 at PAT Stress Test - denies ECHO - denies Cardiac Cath - denies  Sleep Study - denies   DM- denies  Blood Thinner Instructions: n/a Aspirin Instructions: n/a  ERAS Protcol - yes PRE-SURGERY Ensure given at PAT  COVID TEST- n/a, ambulatory surgery   Anesthesia review: no  Patient denies shortness of breath, fever, cough and chest pain at PAT appointment   All instructions explained to the patient, with a verbal understanding of the material. Patient agrees to go over the instructions while at home for a better understanding. Patient also instructed to wear a mask in public for 3 days prior to surgery. The opportunity to ask questions was provided.

## 2021-09-25 ENCOUNTER — Ambulatory Visit (HOSPITAL_COMMUNITY)
Admission: RE | Admit: 2021-09-25 | Discharge: 2021-09-25 | Disposition: A | Discharge status: Home / Self Care | Payer: 59 | Attending: Orthopedic Surgery | Admitting: Orthopedic Surgery

## 2021-09-25 ENCOUNTER — Ambulatory Visit (HOSPITAL_COMMUNITY)
Admission: RE | Disposition: A | Discharge status: Home / Self Care | Payer: Self-pay | Source: Home / Self Care | Attending: Orthopedic Surgery

## 2021-09-25 ENCOUNTER — Ambulatory Visit (HOSPITAL_COMMUNITY): Payer: 59

## 2021-09-25 ENCOUNTER — Other Ambulatory Visit: Payer: Self-pay

## 2021-09-25 ENCOUNTER — Ambulatory Visit (HOSPITAL_COMMUNITY): Payer: 59 | Admitting: Anesthesiology

## 2021-09-25 ENCOUNTER — Encounter (HOSPITAL_COMMUNITY): Payer: Self-pay | Admitting: Orthopedic Surgery

## 2021-09-25 DIAGNOSIS — F17210 Nicotine dependence, cigarettes, uncomplicated: Secondary | ICD-10-CM | POA: Diagnosis not present

## 2021-09-25 DIAGNOSIS — Z79899 Other long term (current) drug therapy: Secondary | ICD-10-CM | POA: Diagnosis not present

## 2021-09-25 DIAGNOSIS — I10 Essential (primary) hypertension: Secondary | ICD-10-CM | POA: Insufficient documentation

## 2021-09-25 DIAGNOSIS — M4802 Spinal stenosis, cervical region: Secondary | ICD-10-CM | POA: Diagnosis not present

## 2021-09-25 DIAGNOSIS — M5412 Radiculopathy, cervical region: Secondary | ICD-10-CM | POA: Diagnosis present

## 2021-09-25 DIAGNOSIS — Z419 Encounter for procedure for purposes other than remedying health state, unspecified: Secondary | ICD-10-CM

## 2021-09-25 HISTORY — PX: ANTERIOR CERVICAL DECOMPRESSION/DISCECTOMY FUSION 4 LEVELS: SHX5556

## 2021-09-25 SURGERY — ANTERIOR CERVICAL DECOMPRESSION/DISCECTOMY FUSION 4 LEVELS
Anesthesia: General | Site: Neck

## 2021-09-25 MED ORDER — THROMBIN 20000 UNITS EX SOLR
CUTANEOUS | Status: DC | PRN
  Administered 2021-09-25: 20000 [IU] via TOPICAL

## 2021-09-25 MED ORDER — CEFAZOLIN SODIUM-DEXTROSE 2-4 GM/100ML-% IV SOLN
2.0000 g | INTRAVENOUS | Status: AC
  Administered 2021-09-25: 2 g via INTRAVENOUS
  Filled 2021-09-25: qty 100

## 2021-09-25 MED ORDER — BUPIVACAINE-EPINEPHRINE 0.25% -1:200000 IJ SOLN
INTRAMUSCULAR | Status: DC | PRN
  Administered 2021-09-25: 4 mL

## 2021-09-25 MED ORDER — CELECOXIB 200 MG PO CAPS
200.0000 mg | ORAL_CAPSULE | Freq: Once | ORAL | Status: AC
  Administered 2021-09-25: 200 mg via ORAL
  Filled 2021-09-25: qty 1

## 2021-09-25 MED ORDER — HYDROCODONE-ACETAMINOPHEN 5-325 MG PO TABS
1.0000 | ORAL_TABLET | Freq: Four times a day (QID) | ORAL | 0 refills | Status: AC | PRN
Start: 2021-09-25 — End: ?

## 2021-09-25 MED ORDER — FENTANYL CITRATE (PF) 100 MCG/2ML IJ SOLN: 25.0000 ug | INTRAMUSCULAR | Status: DC | PRN

## 2021-09-25 MED ORDER — LIDOCAINE 2% (20 MG/ML) 5 ML SYRINGE
INTRAMUSCULAR | Status: DC | PRN
  Administered 2021-09-25: 40 mg via INTRAVENOUS

## 2021-09-25 MED ORDER — PROPOFOL 10 MG/ML IV BOLUS
INTRAVENOUS | Status: DC | PRN
  Administered 2021-09-25: 150 mg via INTRAVENOUS

## 2021-09-25 MED ORDER — POVIDONE-IODINE 7.5 % EX SOLN
Freq: Once | CUTANEOUS | Status: AC
  Administered 2021-09-25: 2 via TOPICAL
  Filled 2021-09-25: qty 118

## 2021-09-25 MED ORDER — ORAL CARE MOUTH RINSE: 15.0000 mL | Freq: Once | OROMUCOSAL | Status: AC

## 2021-09-25 MED ORDER — MIDAZOLAM HCL 5 MG/5ML IJ SOLN
INTRAMUSCULAR | Status: DC | PRN
  Administered 2021-09-25: 2 mg via INTRAVENOUS

## 2021-09-25 MED ORDER — FENTANYL CITRATE (PF) 100 MCG/2ML IJ SOLN
INTRAMUSCULAR | Status: DC | PRN
  Administered 2021-09-25: 100 ug via INTRAVENOUS

## 2021-09-25 MED ORDER — MIDAZOLAM HCL 2 MG/2ML IJ SOLN
INTRAMUSCULAR | Status: AC
  Filled 2021-09-25: qty 2

## 2021-09-25 MED ORDER — LACTATED RINGERS IV SOLN: INTRAVENOUS | Status: DC

## 2021-09-25 MED ORDER — FENTANYL CITRATE (PF) 250 MCG/5ML IJ SOLN
INTRAMUSCULAR | Status: AC
  Filled 2021-09-25: qty 5

## 2021-09-25 MED ORDER — PROMETHAZINE HCL 25 MG/ML IJ SOLN
6.2500 mg | INTRAMUSCULAR | Status: DC | PRN
Start: 2021-09-25 — End: 2021-09-25

## 2021-09-25 MED ORDER — CHLORHEXIDINE GLUCONATE 0.12 % MT SOLN
15.0000 mL | Freq: Once | OROMUCOSAL | Status: AC
  Administered 2021-09-25: 15 mL via OROMUCOSAL
  Filled 2021-09-25: qty 15

## 2021-09-25 MED ORDER — PHENYLEPHRINE HCL-NACL 20-0.9 MG/250ML-% IV SOLN
INTRAVENOUS | Status: DC | PRN
  Administered 2021-09-25: 40 ug/min via INTRAVENOUS

## 2021-09-25 MED ORDER — SUGAMMADEX SODIUM 200 MG/2ML IV SOLN
INTRAVENOUS | Status: DC | PRN
  Administered 2021-09-25: 200 mg via INTRAVENOUS

## 2021-09-25 MED ORDER — LIDOCAINE 2% (20 MG/ML) 5 ML SYRINGE
INTRAMUSCULAR | Status: AC
  Filled 2021-09-25: qty 5

## 2021-09-25 MED ORDER — ACETAMINOPHEN 500 MG PO TABS
1000.0000 mg | ORAL_TABLET | Freq: Once | ORAL | Status: AC
  Administered 2021-09-25: 1000 mg via ORAL
  Filled 2021-09-25: qty 2

## 2021-09-25 MED ORDER — THROMBIN (RECOMBINANT) 20000 UNITS EX SOLR
CUTANEOUS | Status: AC
  Filled 2021-09-25: qty 20000

## 2021-09-25 MED ORDER — EPHEDRINE SULFATE-NACL 50-0.9 MG/10ML-% IV SOSY
PREFILLED_SYRINGE | INTRAVENOUS | Status: DC | PRN
  Administered 2021-09-25: 5 mg via INTRAVENOUS

## 2021-09-25 MED ORDER — METHOCARBAMOL 500 MG PO TABS
500.0000 mg | ORAL_TABLET | Freq: Four times a day (QID) | ORAL | 0 refills | Status: AC | PRN
Start: 2021-09-25 — End: ?

## 2021-09-25 MED ORDER — ROCURONIUM BROMIDE 100 MG/10ML IV SOLN
INTRAVENOUS | Status: DC | PRN
  Administered 2021-09-25: 60 mg via INTRAVENOUS

## 2021-09-25 MED ORDER — 0.9 % SODIUM CHLORIDE (POUR BTL) OPTIME
TOPICAL | Status: DC | PRN
  Administered 2021-09-25: 1000 mL

## 2021-09-25 MED ORDER — AMISULPRIDE (ANTIEMETIC) 5 MG/2ML IV SOLN: 10.0000 mg | Freq: Once | INTRAVENOUS | Status: DC | PRN

## 2021-09-25 SURGICAL SUPPLY — 68 items
BAG COUNTER SPONGE SURGICOUNT (BAG) ×2 IMPLANT
BAG SURGICOUNT SPONGE COUNTING (BAG) ×1
BENZOIN TINCTURE PRP APPL 2/3 (GAUZE/BANDAGES/DRESSINGS) ×3 IMPLANT
BIT DRILL NEURO 2X3.1 SFT TUCH (MISCELLANEOUS) ×1 IMPLANT
BIT DRILL SRG 14X2.2XFLT CHK (BIT) ×1 IMPLANT
BIT DRL SRG 14X2.2XFLT CHK (BIT) ×1
BLADE CLIPPER SURG (BLADE) ×3 IMPLANT
BLADE SURG 15 STRL LF DISP TIS (BLADE) ×1 IMPLANT
BLADE SURG 15 STRL SS (BLADE) ×3
BUR MATCHSTICK NEURO 3.0 LAGG (BURR) ×3 IMPLANT
CARTRIDGE OIL MAESTRO DRILL (MISCELLANEOUS) ×1 IMPLANT
CLOSURE WOUND 1/2 X4 (GAUZE/BANDAGES/DRESSINGS) ×1
COVER SURGICAL LIGHT HANDLE (MISCELLANEOUS) ×3 IMPLANT
DIFFUSER DRILL AIR PNEUMATIC (MISCELLANEOUS) ×3 IMPLANT
DRAPE C-ARM 42X72 X-RAY (DRAPES) ×3 IMPLANT
DRAPE POUCH INSTRU U-SHP 10X18 (DRAPES) ×3 IMPLANT
DRAPE SURG 17X23 STRL (DRAPES) ×9 IMPLANT
DRILL BIT SKYLINE 14MM (BIT) ×3
DRILL NEURO 2X3.1 SOFT TOUCH (MISCELLANEOUS) ×3
DURAPREP 26ML APPLICATOR (WOUND CARE) ×3 IMPLANT
ELECT COATED BLADE 2.86 ST (ELECTRODE) ×3 IMPLANT
ELECT REM PT RETURN 9FT ADLT (ELECTROSURGICAL) ×3
ELECTRODE REM PT RTRN 9FT ADLT (ELECTROSURGICAL) ×1 IMPLANT
GAUZE 4X4 16PLY ~~LOC~~+RFID DBL (SPONGE) ×3 IMPLANT
GAUZE SPONGE 4X4 12PLY STRL (GAUZE/BANDAGES/DRESSINGS) ×3 IMPLANT
GLOVE SRG 8 PF TXTR STRL LF DI (GLOVE) ×1 IMPLANT
GLOVE SURG ENC MOIS LTX SZ6.5 (GLOVE) ×3 IMPLANT
GLOVE SURG ENC MOIS LTX SZ8 (GLOVE) ×3 IMPLANT
GLOVE SURG UNDER POLY LF SZ7 (GLOVE) ×6 IMPLANT
GLOVE SURG UNDER POLY LF SZ8 (GLOVE) ×3
GOWN STRL REUS W/ TWL LRG LVL3 (GOWN DISPOSABLE) ×1 IMPLANT
GOWN STRL REUS W/ TWL XL LVL3 (GOWN DISPOSABLE) ×1 IMPLANT
GOWN STRL REUS W/TWL LRG LVL3 (GOWN DISPOSABLE) ×3
GOWN STRL REUS W/TWL XL LVL3 (GOWN DISPOSABLE) ×2
INTERLOCK LRDTC CRVCL VBR 7MM (Bone Implant) ×3 IMPLANT
IV CATH 14GX2 1/4 (CATHETERS) ×3 IMPLANT
KIT BASIN OR (CUSTOM PROCEDURE TRAY) ×3 IMPLANT
KIT TURNOVER KIT B (KITS) ×3 IMPLANT
LORDOTIC CERVICAL VBR 7MM SM (Bone Implant) ×9 IMPLANT
MANIFOLD NEPTUNE II (INSTRUMENTS) ×3 IMPLANT
NEEDLE PRECISIONGLIDE 27X1.5 (NEEDLE) ×3 IMPLANT
NEEDLE SPNL 20GX3.5 QUINCKE YW (NEEDLE) ×3 IMPLANT
NS IRRIG 1000ML POUR BTL (IV SOLUTION) ×3 IMPLANT
OIL CARTRIDGE MAESTRO DRILL (MISCELLANEOUS) ×3
PACK ORTHO CERVICAL (CUSTOM PROCEDURE TRAY) ×3 IMPLANT
PAD ARMBOARD 7.5X6 YLW CONV (MISCELLANEOUS) ×6 IMPLANT
PATTIES SURGICAL .5 X.5 (GAUZE/BANDAGES/DRESSINGS) ×3 IMPLANT
PATTIES SURGICAL .5 X1 (DISPOSABLE) ×3 IMPLANT
PIN DISTRACTION 14 (PIN) ×6 IMPLANT
PLATE SKYLINE 3LVL 57M SPINE (Plate) ×3 IMPLANT
POSITIONER HEAD DONUT 9IN (MISCELLANEOUS) ×3 IMPLANT
PUTTY BONE DBX 5CC MIX (Putty) ×3 IMPLANT
SCREW SKYLINE VAR OS 14MM (Screw) ×21 IMPLANT
SCREW VAR SELF TAP SKYLINE 14M (Screw) ×3 IMPLANT
SPONGE INTESTINAL PEANUT (DISPOSABLE) ×9 IMPLANT
SPONGE SURGIFOAM ABS GEL 100 (HEMOSTASIS) ×3 IMPLANT
STRIP CLOSURE SKIN 1/2X4 (GAUZE/BANDAGES/DRESSINGS) ×2 IMPLANT
SUT MNCRL AB 4-0 PS2 18 (SUTURE) ×3 IMPLANT
SUT VIC AB 2-0 CT2 18 VCP726D (SUTURE) ×3 IMPLANT
SYR BULB IRRIG 60ML STRL (SYRINGE) ×3 IMPLANT
SYR CONTROL 10ML LL (SYRINGE) ×6 IMPLANT
TAPE CLOTH 4X10 WHT NS (GAUZE/BANDAGES/DRESSINGS) ×3 IMPLANT
TAPE UMBILICAL COTTON 1/8X30 (MISCELLANEOUS) ×3 IMPLANT
TOWEL GREEN STERILE (TOWEL DISPOSABLE) ×3 IMPLANT
TOWEL GREEN STERILE FF (TOWEL DISPOSABLE) ×3 IMPLANT
TRAY FOLEY MTR SLVR 16FR STAT (SET/KITS/TRAYS/PACK) ×3 IMPLANT
WATER STERILE IRR 1000ML POUR (IV SOLUTION) ×3 IMPLANT
YANKAUER SUCT BULB TIP NO VENT (SUCTIONS) ×3 IMPLANT

## 2021-09-25 NOTE — Anesthesia Preprocedure Evaluation (Addendum)
Anesthesia Evaluation  Patient identified by MRN, date of birth, ID band Patient awake    Reviewed: Allergy & Precautions, NPO status , Patient's Chart, lab work & pertinent test results  History of Anesthesia Complications Negative for: history of anesthetic complications  Airway Mallampati: III  TM Distance: >3 FB Neck ROM: Full    Dental  (+) Dental Advisory Given   Pulmonary Current Smoker and Patient abstained from smoking.,    Pulmonary exam normal        Cardiovascular hypertension, Pt. on medications Normal cardiovascular exam     Neuro/Psych  Neuromuscular disease    GI/Hepatic negative GI ROS, Neg liver ROS,   Endo/Other  negative endocrine ROS  Renal/GU negative Renal ROS     Musculoskeletal negative musculoskeletal ROS (+)   Abdominal   Peds  Hematology negative hematology ROS (+)   Anesthesia Other Findings   Reproductive/Obstetrics                            Anesthesia Physical Anesthesia Plan  ASA: 2  Anesthesia Plan: General   Post-op Pain Management: Celebrex PO (pre-op) and Tylenol PO (pre-op)   Induction: Intravenous  PONV Risk Score and Plan: 3 and Ondansetron, Dexamethasone and Midazolam  Airway Management Planned: Oral ETT  Additional Equipment:   Intra-op Plan:   Post-operative Plan: Extubation in OR  Informed Consent: I have reviewed the patients History and Physical, chart, labs and discussed the procedure including the risks, benefits and alternatives for the proposed anesthesia with the patient or authorized representative who has indicated his/her understanding and acceptance.     Dental advisory given  Plan Discussed with: Anesthesiologist and CRNA  Anesthesia Plan Comments:        Anesthesia Quick Evaluation

## 2021-09-25 NOTE — Anesthesia Procedure Notes (Signed)
Procedure Name: Intubation Date/Time: 09/25/2021 1:30 PM Performed by: Carlos American, CRNA Pre-anesthesia Checklist: Patient identified, Emergency Drugs available, Suction available and Patient being monitored Patient Re-evaluated:Patient Re-evaluated prior to induction Oxygen Delivery Method: Circle system utilized Preoxygenation: Pre-oxygenation with 100% oxygen Induction Type: IV induction Ventilation: Mask ventilation without difficulty Laryngoscope Size: Glidescope and 3 (to avoid neck extension) Grade View: Grade I Tube type: Oral Tube size: 7.5 mm Number of attempts: 1 Airway Equipment and Method: Stylet and Oral airway Placement Confirmation: ETT inserted through vocal cords under direct vision, positive ETCO2 and breath sounds checked- equal and bilateral Tube secured with: Tape Dental Injury: Teeth and Oropharynx as per pre-operative assessment

## 2021-09-25 NOTE — Op Note (Signed)
PATIENT NAME: Eric Everett   MEDICAL RECORD NO.:   [id number]    DATE OF BIRTH: 04-Sep-1961   DATE OF PROCEDURE: 09/25/2021                                OPERATIVE REPORT     PREOPERATIVE DIAGNOSES: 1. Right-sided cervical radiculopathy. 2. Spinal stenosis spanning C4-C7.   POSTOPERATIVE DIAGNOSES: 1. Right-sided cervical radiculopathy. 2. Spinal stenosis spanning C4-C7.   PROCEDURE: 1. Anterior cervical decompression and fusion C4/5, C5/6, C6/7. 2. Placement of anterior instrumentation, C4-C7. 3. Insertion of interbody device x 3 (66mm Titan intervertebral spacers). 4. Intraoperative use of fluoroscopy. 5. Use of morselized allograft - ViviGen.   SURGEON:  Estill Bamberg, MD   ASSISTANT:  Jason Coop, PA-C.   ANESTHESIA:  General endotracheal anesthesia.   COMPLICATIONS:  None.   DISPOSITION:  Stable.   ESTIMATED BLOOD LOSS:  Minimal.   INDICATIONS FOR SURGERY:  Briefly, Mr. Fangman is a pleasant 60 y.o. year- old patient, who did present to me with severe pain in the neck and right arm.  The patient's MRI did reveal the findings noted above.  Given the patient's ongoing rather debilitating pain and lack of improvement with appropriate treatment measures, we did discuss proceeding with the procedure noted above.  The patient was fully aware of the risks and limitations of surgery as outlined in my preoperative note.   OPERATIVE DETAILS:  On 09/25/2021,  the patient was brought to surgery and general endotracheal anesthesia was administered.  The patient was placed supine on the hospital bed. The neck was gently extended.  All bony prominences were meticulously padded.  The neck was prepped and draped in the usual sterile fashion.  At this point, I did make a left-sided transverse incision.  The platysma was incised.  A Smith-Robinson approach was used and the anterior spine was identified. A self-retaining retractor was placed.  I then subperiosteally exposed the  vertebral bodies from C4-C7.  Of note, there were very prominent osteophytes noted anteriorly across each intervertebral space.  I did meticulously remove the osteophytes using a series of rongeurs. Caspar pins were then placed into the C6 and C7 vertebral bodies and distraction was applied.  A thorough and complete C6-7 intervertebral diskectomy was performed.  The posterior longitudinal ligament was identified and entered using a nerve hook.  I then used #1 followed by #2 Kerrison to perform a thorough and complete intervertebral diskectomy.  The spinal canal was thoroughly decompressed, as was the right and left neuroforamen.  The endplates were then prepared and the appropriate-sized intervertebral spacer was then packed with ViviGen and tamped into position in the usual fashion.  The lower Caspar pin was then removed and placed into the C5 vertebral body and once again, distraction was applied across the C5-6 intervertebral space.  I then again performed a thorough and complete diskectomy, thoroughly decompressing the spinal canal and bilateral neuroforamena.  After preparing the endplates, the appropriate-sized intervertebral spacer was packed with ViviGen and tamped into position.  The lower Caspar pin was then removed and placed into the C4 vertebral body and once again, distraction was applied across the C4-5 intervertebral space.  I then again performed a thorough and complete diskectomy, thoroughly decompressing the spinal canal and bilateral neuroforamena.  After preparing the endplates, the appropriate-sized intervertebral spacer was packed with ViviGen and tamped into position.  The Caspar pins then were removed and bone wax  was placed in their place.  The appropriate-sized anterior cervical plate was placed over the anterior spine.  14 mm variable angle screws were placed, 2 in each vertebral body from C4-C7 for a total of 8 vertebral body screws.  The screws were then locked  to the plate using the Cam locking mechanism.  I was very pleased with the final fluoroscopic images.  The wound was then irrigated.  The wound was then explored for any undue bleeding and there was no bleeding noted. The wound was then closed in layers using 2-0 Vicryl, followed by 4-0 Monocryl.  Benzoin and Steri-Strips were applied, followed by sterile dressing.  All instrument counts were correct at the termination of the procedure.   Of note, Jason Coop, PA-C, was my assistant throughout surgery, and did aid in retraction, suctioning, placement of the hardware, and closure from start to finish.         Estill Bamberg, MD

## 2021-09-25 NOTE — Transfer of Care (Signed)
Immediate Anesthesia Transfer of Care Note  Patient: Eric Everett  Procedure(s) Performed: ANTERIOR CERVICAL DECOMPRESSION FUSION CERVICAL 4- CERVICAL 5, CERVICAL 5- CERVICAL 6, CERVICAL 6- CERVICAL 7, WITH INSTRUMENTATION AND ALLOGRAFT (Neck)  Patient Location: PACU  Anesthesia Type:General  Level of Consciousness: awake and drowsy  Airway & Oxygen Therapy: Patient Spontanous Breathing  Post-op Assessment: Report given to RN and Post -op Vital signs reviewed and stable  Post vital signs: Reviewed and stable  Last Vitals:  Vitals Value Taken Time  BP 141/84 09/25/21 1654  Temp    Pulse 60 09/25/21 1655  Resp 14 09/25/21 1655  SpO2 94 % 09/25/21 1655  Vitals shown include unvalidated device data.  Last Pain:  Vitals:   09/25/21 0901  TempSrc:   PainSc: 7          Complications: No notable events documented.

## 2021-09-25 NOTE — H&P (Signed)
PREOPERATIVE H&P  Chief Complaint: Right arm pain  HPI: Eric Everett is a 60 y.o. male who presents with ongoing pain in the right arm  MRI reveals spinal stenosis spanning C4-C7  Patient has failed multiple forms of conservative care and continues to have pain (see office notes for additional details regarding the patient's full course of treatment)  Past Medical History:  Diagnosis Date   Allergy    Arthritis    L4-5- HNP   Chest pain    Constipation    Hyperlipidemia    Hypertension    No pertinent past medical history    Past Surgical History:  Procedure Laterality Date   EYE SURGERY     1982, sports injury - R eye   LAMINECTOMY AND MICRODISCECTOMY LUMBAR SPINE  12/29/11   right L4-5   LUMBAR LAMINECTOMY/DECOMPRESSION MICRODISCECTOMY  12/29/2011   Procedure: LUMBAR LAMINECTOMY/DECOMPRESSION MICRODISCECTOMY 1 LEVEL;  Surgeon: Temple Pacini, MD;  Location: [facility];  Service: Neurosurgery;  Laterality: Right;  Right Lumbar Four-Five Laminectomy Microdiskectomy   Social History   Socioeconomic History   Marital status: Married    Spouse name: Not on file   Number of children: Not on file   Years of education: Not on file   Highest education level: Not on file  Occupational History   Not on file  Tobacco Use   Smoking status: Every Day    Packs/day: 0.25    Years: 20.00    Pack years: 5.00    Types: Cigarettes   Smokeless tobacco: Never   Tobacco comments:    "no consult; I quit after today" (12/29/11)  Vaping Use   Vaping Use: Never used  Substance and Sexual Activity   Alcohol use: Yes    Comment: maybe once every 2 weeks pt has "a couple of mixed drinks"   Drug use: Not Currently   Sexual activity: Never  Other Topics Concern   Not on file  Social History Narrative   Not on file   Social Determinants of Health   Financial Resource Strain: Not on file  Food Insecurity: Not on file  Transportation Needs: Not on file  Physical Activity: Not  on file  Stress: Not on file  Social Connections: Not on file   Family History  Problem Relation Age of Onset   Heart disease Father    Cancer Sister    Stroke Brother    Anesthesia problems Neg Hx    Allergies  Allergen Reactions   Dairy Aid [Tilactase]     Muscle break down   Prior to Admission medications   Medication Sig Start Date End Date Taking? Authorizing Provider  acetaminophen-codeine (TYLENOL #3) 300-30 MG tablet Take 1 tablet by mouth every 8 (eight) hours as needed for severe pain. 09/06/21  Yes [provider]  amLODipine (NORVASC) 10 MG tablet Take 10 mg by mouth daily.   Yes [provider]  HYDROcodone-acetaminophen (NORCO/VICODIN) 5-325 MG tablet Take 1 tablet by mouth every 6 (six) hours as needed. 08/05/21  Yes [provider]  IBU 800 MG tablet Take 800 mg by mouth 3 (three) times daily as needed for moderate pain. 08/07/21  Yes [provider]     All other systems have been reviewed and were otherwise negative with the exception of those mentioned in the HPI and as above.  Physical Exam: There were no vitals filed for this visit.  There is no height or weight on file to calculate  BMI.  General: Alert, no acute distress Cardiovascular: No pedal edema Respiratory: No cyanosis, no use of accessory musculature Skin: No lesions in the area of chief complaint Neurologic: Sensation intact distally Psychiatric: Patient is competent for consent with normal mood and affect Lymphatic: No axillary or cervical lymphadenopathy   Assessment/Plan: RIGHT CERVICAL RADICULOPATHY Plan for Procedure(s): ANTERIOR CERVICAL DECOMPRESSION FUSION CERVICAL 4- CERVICAL 5, CERVICAL 5- CERVICAL 6, CERVICAL 6- CERVICAL 7, WITH INSTRUMENTATION AND ALLOGRAFT   Jackelyn Hoehn, MD 09/25/2021 6:50 AM

## 2021-09-25 NOTE — Anesthesia Postprocedure Evaluation (Signed)
Anesthesia Post Note  Patient: Montrel Donahoe  Procedure(s) Performed: ANTERIOR CERVICAL DECOMPRESSION FUSION CERVICAL 4- CERVICAL 5, CERVICAL 5- CERVICAL 6, CERVICAL 6- CERVICAL 7, WITH INSTRUMENTATION AND ALLOGRAFT (Neck)     Patient location during evaluation: PACU Anesthesia Type: General Level of consciousness: awake and alert Pain management: pain level controlled Vital Signs Assessment: post-procedure vital signs reviewed and stable Respiratory status: spontaneous breathing, nonlabored ventilation, respiratory function stable and patient connected to nasal cannula oxygen Cardiovascular status: blood pressure returned to baseline and stable Postop Assessment: no apparent nausea or vomiting Anesthetic complications: no   No notable events documented.  Last Vitals:  Vitals:   09/25/21 1710 09/25/21 1725  BP: (!) 148/91 131/89  Pulse: (!) 58 (!) 59  Resp: 15 14  Temp:  36.7 C  SpO2: 96% 95%    Last Pain:  Vitals:   09/25/21 1725  TempSrc:   PainSc: 0-No pain                 Shelton Silvas

## 2021-09-26 MED FILL — Thrombin (Recombinant) For Soln 20000 Unit: CUTANEOUS | Qty: 1 | Status: AC

## 2021-09-30 ENCOUNTER — Encounter (HOSPITAL_COMMUNITY): Payer: Self-pay | Admitting: Orthopedic Surgery

## 2023-08-10 IMAGING — MR MR CERVICAL SPINE W/O CM
4 of 5 series · 28 of 48 positions shown · non-contrast
Comparison: 05/16/2019

CLINICAL DATA: Neck pain with right shoulder and arm pain and
numbness for 3 months

EXAM:
MRI CERVICAL SPINE WITHOUT CONTRAST
TECHNIQUE: Multiplanar, multisequence MR imaging of the cervical spine was
performed. No intravenous contrast was administered.

[Series 2: T2 · sagittal · 3.0mm · 0.66mm/px · 6 of 15 slices shown (1 of 2)]
[im 1/15]
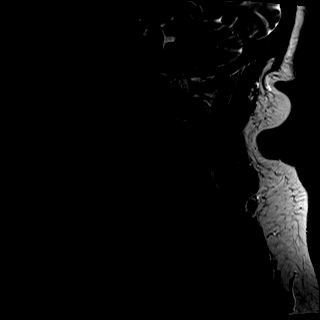
[im 3/15]
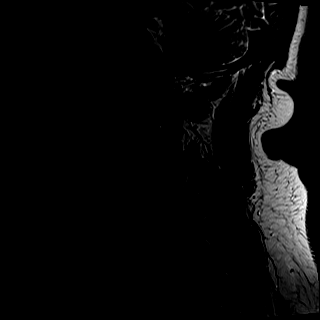
[im 6/15]
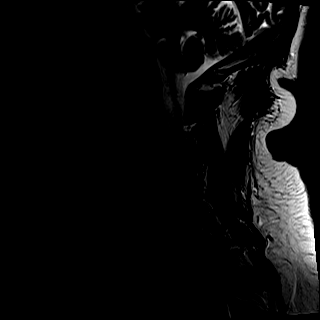
[im 9/15]
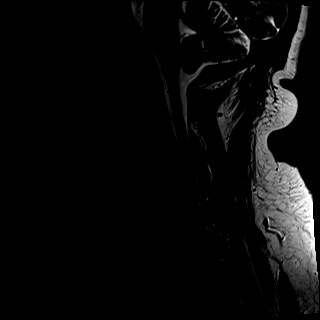
[im 12/15]
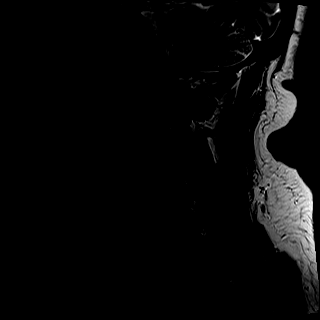
[im 15/15]
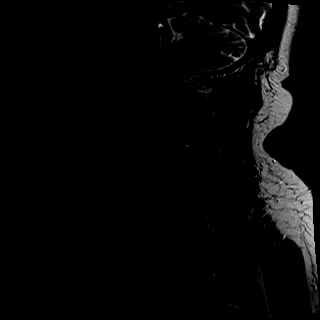

[Series 3: T1 · sagittal · 3.0mm · 0.41mm/px · 7 of 15 slices shown]
[im 1/15]
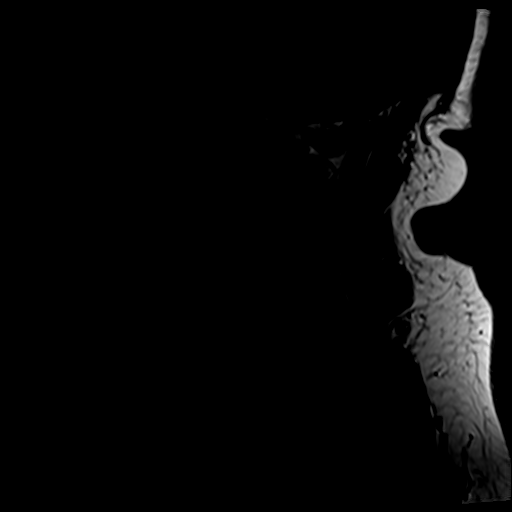
[im 3/15]
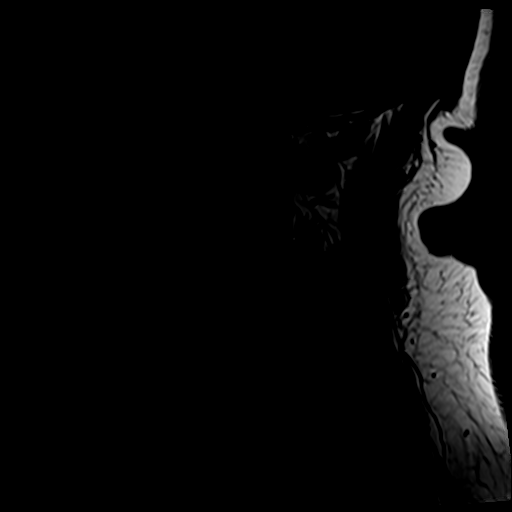
[im 5/15]
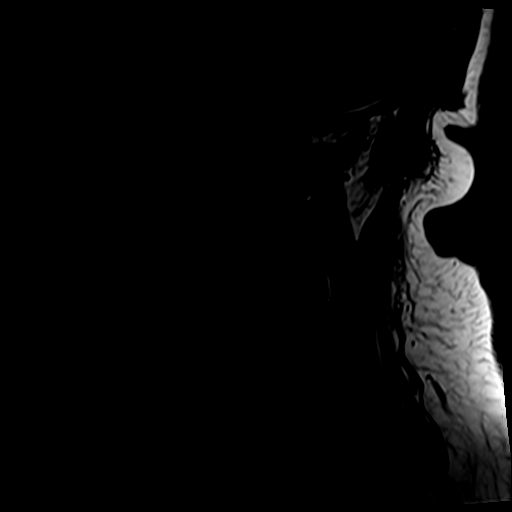
[im 8/15]
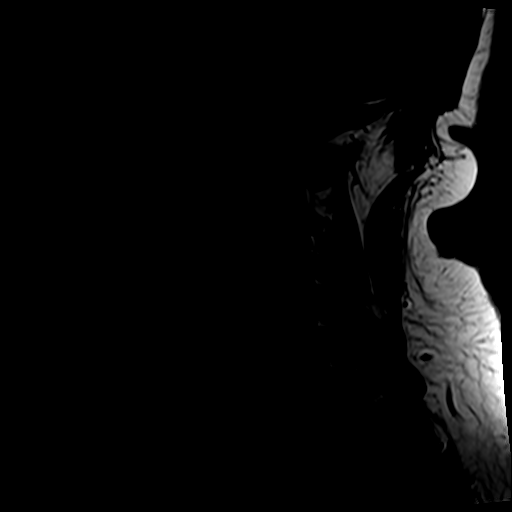
[im 10/15]
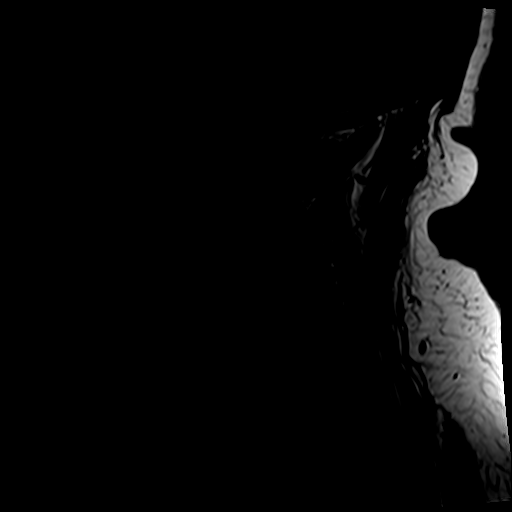
[im 12/15]
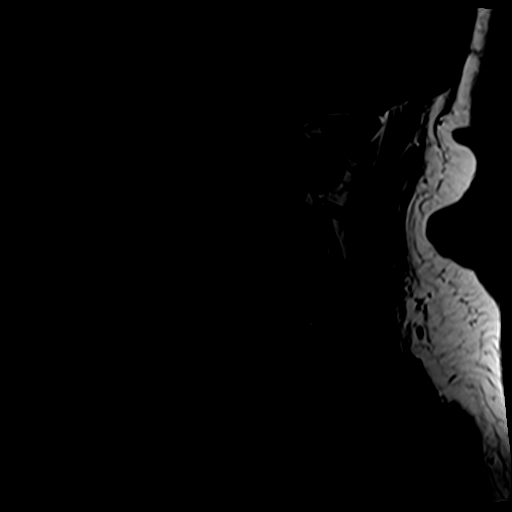
[im 15/15]
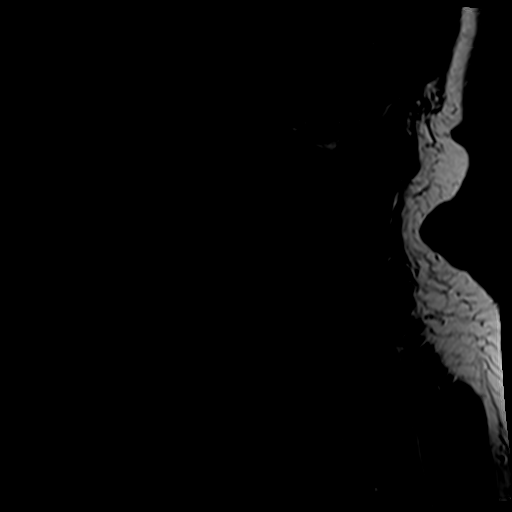

[Series 4: tir sag · sagittal · 3.0mm · 0.41mm/px · 7 of 15 slices shown]
[im 1/15]
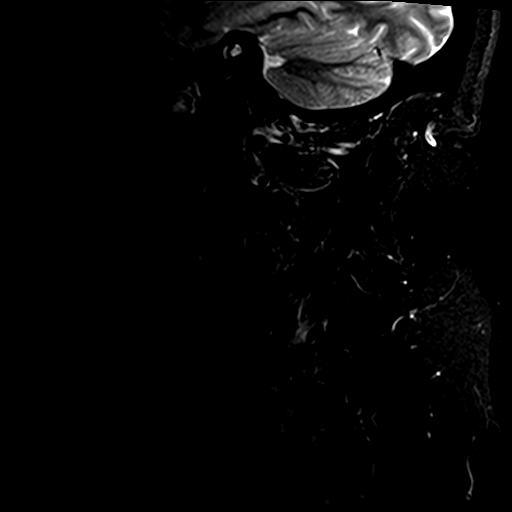
[im 3/15]
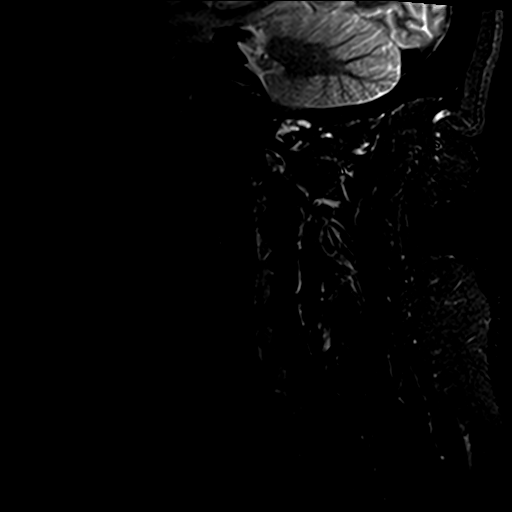
[im 5/15]
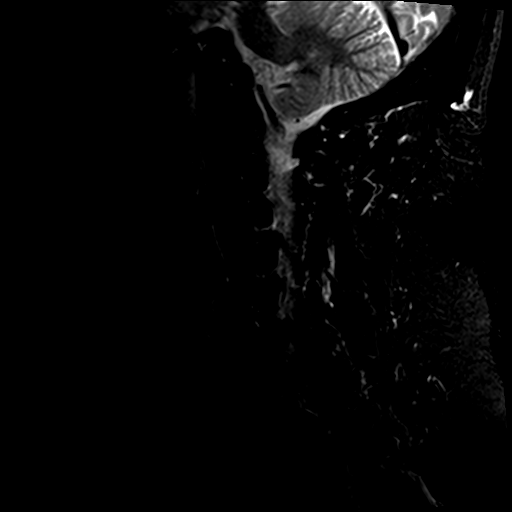
[im 8/15]
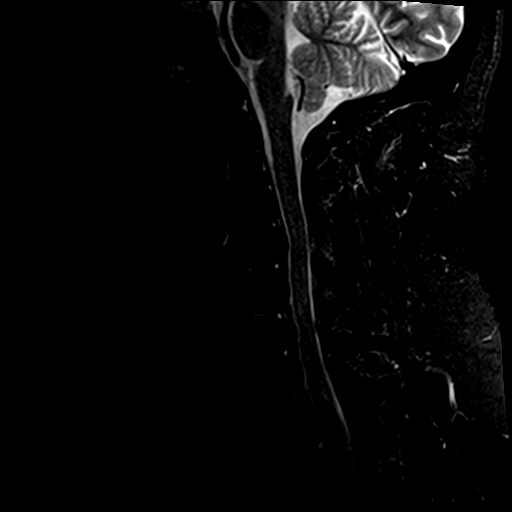
[im 10/15]
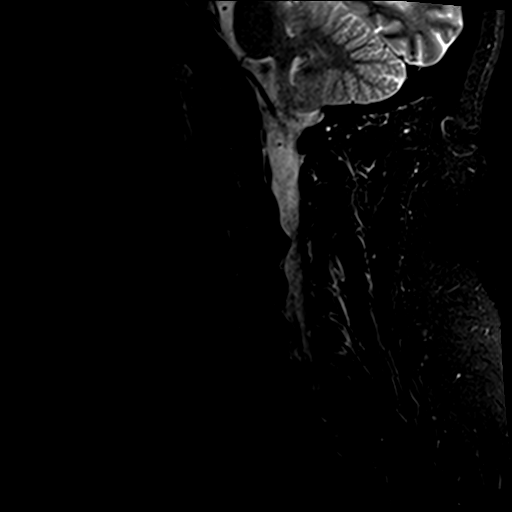
[im 12/15]
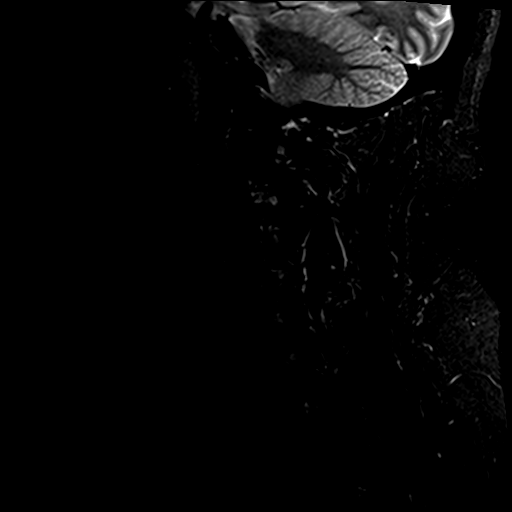
[im 15/15]
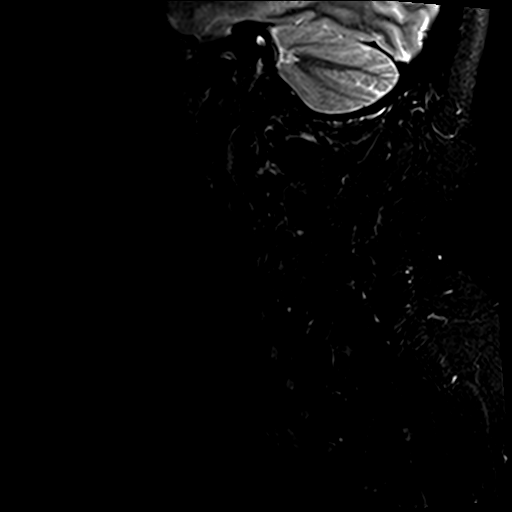

[Series 6: T2 · axial · 3.0mm · 0.70mm/px · z∈[-71,+42]mm · 8 of 32 slices shown (2 of 2)]
[im 1/32]
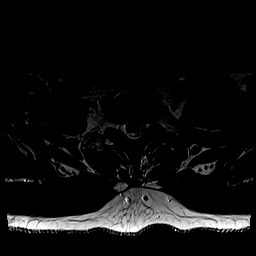
[im 5/32]
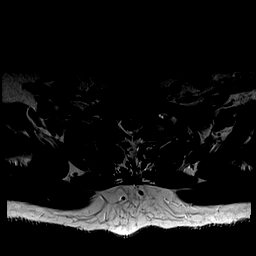
[im 10/32]
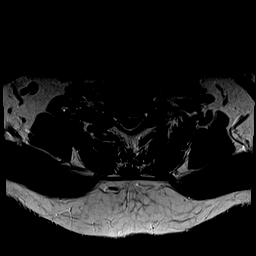
[im 15/32]
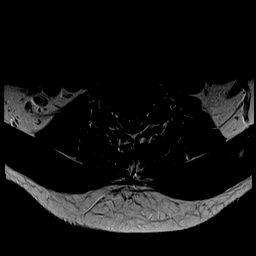
[im 17/32]
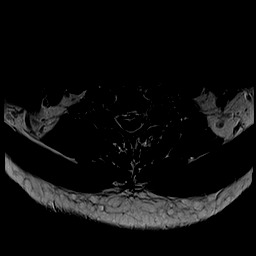
[im 22/32]
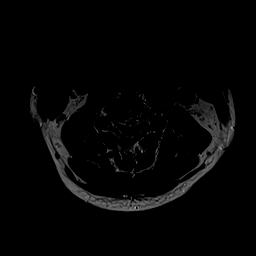
[im 27/32]
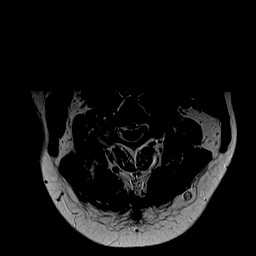
[im 32/32]
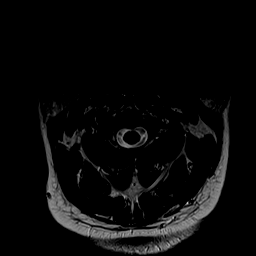

[28 of 48 positions shown; findings below may reference images not displayed]

FINDINGS: Alignment: Reversal of cervical lordosis.

Vertebrae: No fracture, evidence of discitis, or bone lesion.

Cord: Normal signal and morphology.

Posterior Fossa, vertebral arteries, paraspinal tissues: Negative.

Disc levels:

C2-3: Facet spurring with ankylosis on the right

C3-4: Disc narrowing and bulging with uncovertebral spurring
bilaterally. Mild bilateral foraminal stenosis.

C4-5: Disc narrowing and bulging with uncovertebral spurring.
Moderate right foraminal narrowing

C5-6: Disc narrowing and bulging with shallow central protrusion
that indents the ventral cord. Mild bilateral foraminal narrowing

C6-7: Right foraminal protrusion and C7 impingement. Mild left
foraminal narrowing disc height loss and bulging.

C7-T1:Mild facet spurring and anterolisthesis.
IMPRESSION: 1. Generalized cervical spine degeneration with multilevel foraminal
narrowing.
2. C4-5 moderate right foraminal narrowing
3. C5-6 central disc protrusion flattening the ventral cord.
4. C6-7 right foraminal protrusion and C7 impingement.
5. C2-3 facet ankylosis on the left.

## 2024-11-01 NOTE — Progress Notes (Signed)
 Subjective:    Patient's medications, allergies, past medical, surgical, social and family histories were reviewed and updated as appropriate.  HPI Eric Everett is a 63 y.o. male patient presents today for an acute visit with complaints of lower left dental pain, swollen gum in the back. Symptoms started 3 days ago and are ongoing. Aggravating factors:putting teeth together. Alleviating factors: advil. Patient has not been in contact with someone else who is ill. Treatment to date:11/01/24 with and without improvement.    Body mass index is 27.76 kg/m.. I discussed his BMI. He has been counseled on nutrition in the office during this visit .    PHQ: PHQ-9 Total Score: 0    Interpretation of Total Score Depression Severity:  1-4 = Minimal depression,  5-9 = Mild depression,  10-14 = Moderate depression,  15-19 = Moderately severe depression,  20-27 = Severe depression   Depression screening was performed with standardized tool: Screening score shows no depression.  Are you having any difficulty affording or accessing medications, utilities, housing, transportation, or food? no Have you had any experiences where you felt unsafe, threatened, or concerns about your safety?  no If yes, would you like to speak with a case worker about resources for these things?  no   Social Drivers of Health   Tobacco Use: High Risk (11/01/2024)  Depression: Not at risk (11/01/2024)     Patient screened high today for [facility] need:No risks Identified  Patients PCP is Eric Everett, and was last seen by them on unknown.   Would you like to receive a flu vaccine today? No  Vitals:   11/01/24 1456  BP: 150/84  Pulse: 79  Resp: 20  Temp: 98.4 F (36.9 C)  SpO2: 98%    Review of Systems  Constitutional:  Negative for activity change, appetite change, chills, fatigue and fever.  HENT:  Positive for dental problem. Negative for congestion, ear pain, facial swelling, rhinorrhea and sore  throat.   Eyes:  Negative for discharge and redness.  Respiratory:  Negative for cough, shortness of breath and wheezing.   Cardiovascular:  Negative for chest pain and leg swelling.  Gastrointestinal:  Negative for abdominal pain, diarrhea and vomiting.  Genitourinary:  Negative for decreased urine volume.  Musculoskeletal:  Negative for arthralgias and myalgias.  Skin:  Negative for rash.  Neurological:  Negative for weakness and headaches.    Physical Exam Vitals and nursing note reviewed.  Constitutional:      General: He is awake. He is not in acute distress.    Appearance: Normal appearance. He is not ill-appearing, toxic-appearing or diaphoretic.  HENT:     Head: Normocephalic and atraumatic.     Right Ear: External ear normal.     Left Ear: External ear normal.     Nose: Nose normal.     Mouth/Throat:     Dentition: Abnormal dentition. Does not have dentures. Dental abscesses present. No dental tenderness, gingival swelling, dental caries or gum lesions.      Comments: Cavity with decay to most posterior lower left tooth with gum swelling Eyes:     General:        Right eye: No discharge.        Left eye: No discharge.     Extraocular Movements: Extraocular movements intact.     Conjunctiva/sclera: Conjunctivae normal.  Cardiovascular:     Rate and Rhythm: Normal rate.  Pulmonary:     Effort: Pulmonary effort is normal. No respiratory distress.  Breath sounds: Normal breath sounds.  Musculoskeletal:     Right lower leg: No edema.     Left lower leg: No edema.  Skin:    General: Skin is warm.     Findings: No rash.  Neurological:     General: No focal deficit present.     Mental Status: He is alert and oriented to person, place, and time.     Gait: Gait normal.  Psychiatric:        Mood and Affect: Mood normal.        Behavior: Behavior normal. Behavior is cooperative.     Assessment:   Eric Everett was seen today for oral pain.  Diagnoses and all orders  for this visit:  Acute pain of mouth -     amoxicillin-clavulanate (AUGMENTIN) 875-125 MG per tablet; Take 1 tablet by mouth two times a day for 10 days.  Swelling of gums -     amoxicillin-clavulanate (AUGMENTIN) 875-125 MG per tablet; Take 1 tablet by mouth two times a day for 10 days.  Dental infection     Plan:  Medication as above.  Alternate Tylenol  and Ibuprofen prn if able. Ice the jaw frequently.   F/u with dentistry asap, he states he is established with a dentist in his home state and has already called them about this issue.  F/u with PCP or in UC prn for symptoms.   -Plan outlined as above under appropriate diagnosis.  -Pt agrees and expresses understanding of the above treatment plan. All questions/concerns answered including risk vs benefits of all new medications, procedures, and imaging recommendations. -Patient will return if symptoms worsen or do not improve.   Return if symptoms worsen or fail to improve.
